# Patient Record
Sex: Female | Born: 1975 | Race: Black or African American | Hispanic: No | Marital: Married | Smoking: Never smoker
Health system: Southern US, Community
[De-identification: ages and names within clinical notes are randomized; demographics above are authoritative.]

## PROBLEM LIST (undated history)

## (undated) DIAGNOSIS — D649 Anemia, unspecified: Secondary | ICD-10-CM

## (undated) DIAGNOSIS — N920 Excessive and frequent menstruation with regular cycle: Secondary | ICD-10-CM

## (undated) DIAGNOSIS — D250 Submucous leiomyoma of uterus: Secondary | ICD-10-CM

## (undated) DIAGNOSIS — N6452 Nipple discharge: Secondary | ICD-10-CM

## (undated) DIAGNOSIS — Z9289 Personal history of other medical treatment: Secondary | ICD-10-CM

## (undated) DIAGNOSIS — G43909 Migraine, unspecified, not intractable, without status migrainosus: Secondary | ICD-10-CM

## (undated) DIAGNOSIS — T8859XA Other complications of anesthesia, initial encounter: Secondary | ICD-10-CM

## (undated) DIAGNOSIS — Z973 Presence of spectacles and contact lenses: Secondary | ICD-10-CM

## (undated) HISTORY — PX: LAPAROSCOPY: SHX197

## (undated) HISTORY — PX: AXILLARY HIDRADENITIS EXCISION: SUR522

## (undated) HISTORY — PX: DILATATION & CURETTAGE/HYSTEROSCOPY WITH MYOSURE: SHX6511

## (undated) HISTORY — PX: HYDRADENITIS EXCISION: SHX5243

---

## 1999-01-11 ENCOUNTER — Emergency Department (HOSPITAL_COMMUNITY): Admission: EM | Admit: 1999-01-11 | Discharge: 1999-01-11 | Payer: Self-pay | Admitting: Emergency Medicine

## 1999-06-21 ENCOUNTER — Other Ambulatory Visit: Admission: RE | Admit: 1999-06-21 | Discharge: 1999-06-21 | Payer: Self-pay | Admitting: Obstetrics & Gynecology

## 2000-03-15 ENCOUNTER — Ambulatory Visit (HOSPITAL_COMMUNITY): Admission: RE | Admit: 2000-03-15 | Discharge: 2000-03-15 | Payer: Self-pay | Admitting: Obstetrics & Gynecology

## 2000-03-15 HISTORY — PX: LAPAROSCOPY: SHX197

## 2002-10-23 ENCOUNTER — Emergency Department (HOSPITAL_COMMUNITY): Admission: EM | Admit: 2002-10-23 | Discharge: 2002-10-23 | Payer: Self-pay | Admitting: Emergency Medicine

## 2002-10-25 ENCOUNTER — Emergency Department (HOSPITAL_COMMUNITY): Admission: EM | Admit: 2002-10-25 | Discharge: 2002-10-25 | Payer: Self-pay | Admitting: Emergency Medicine

## 2003-09-29 ENCOUNTER — Emergency Department (HOSPITAL_COMMUNITY): Admission: EM | Admit: 2003-09-29 | Discharge: 2003-09-29 | Payer: Self-pay | Admitting: Emergency Medicine

## 2004-05-23 ENCOUNTER — Other Ambulatory Visit: Admission: RE | Admit: 2004-05-23 | Discharge: 2004-05-23 | Payer: Self-pay | Admitting: Obstetrics & Gynecology

## 2004-10-17 ENCOUNTER — Encounter: Admission: RE | Admit: 2004-10-17 | Discharge: 2004-10-17 | Payer: Self-pay | Admitting: Obstetrics and Gynecology

## 2004-12-31 ENCOUNTER — Encounter (INDEPENDENT_AMBULATORY_CARE_PROVIDER_SITE_OTHER): Payer: Self-pay | Admitting: Specialist

## 2004-12-31 ENCOUNTER — Inpatient Hospital Stay (HOSPITAL_COMMUNITY): Admission: AD | Admit: 2004-12-31 | Discharge: 2005-01-03 | Payer: Self-pay | Admitting: Obstetrics & Gynecology

## 2009-10-21 ENCOUNTER — Emergency Department (HOSPITAL_COMMUNITY): Admission: EM | Admit: 2009-10-21 | Discharge: 2009-10-21 | Payer: Self-pay | Admitting: Emergency Medicine

## 2010-07-31 LAB — URINE CULTURE: Colony Count: 75000

## 2010-07-31 LAB — POCT URINALYSIS DIP (DEVICE)
Glucose, UA: 250 mg/dL — AB
Specific Gravity, Urine: 1.015 (ref 1.005–1.030)
Urobilinogen, UA: 2 mg/dL — ABNORMAL HIGH (ref 0.0–1.0)

## 2010-07-31 LAB — URINALYSIS, MICROSCOPIC ONLY
Ketones, ur: 15 mg/dL — AB
Specific Gravity, Urine: 1.01 (ref 1.005–1.030)
Urobilinogen, UA: 1 mg/dL (ref 0.0–1.0)

## 2010-07-31 LAB — GLUCOSE, CAPILLARY: Glucose-Capillary: 107 mg/dL — ABNORMAL HIGH (ref 70–99)

## 2010-07-31 LAB — POCT PREGNANCY, URINE: Preg Test, Ur: NEGATIVE

## 2010-09-29 NOTE — Op Note (Signed)
[facility]  Patient:    ICESS, BERTONI                         MRN: [id number] Proc. Date: 03/15/00 Adm. Date:  [id number] Attending:  Lars Pinks                           Operative Report  PREOPERATIVE DIAGNOSES:       Chronic left lower quadrant pain, dyspareunia, dysmenorrhea.  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  PROCEDURE:                    Laparoscopy, destruction of hydatid of Morgagni.  SURGEON:                      Richard D. Arlyce Dice, M.D.  ANESTHESIA:                   General endotracheal.  ESTIMATED BLOOD LOSS:         20 cc.  FINDINGS:                     The right tube and ovary were perfectly normal. The left ovary was perfectly normal.  The left fallopian tube was free and mobile.  There were multiple small hydatid of Morgagni on the distal portion of the left tube and one of these was on a long stalk that appeared torsed. The uterus appeared normal.  There was no evidence of adhesions or endometriosis in the cul-de-sac or along the uterosacrals or anywhere else in the pelvis.  The appendix appeared normal.  INDICATIONS:                  This is a 35 year old female who has had chronic pelvic pain for several years.  The patients pain is only poorly controlled with over-the-counter pain medications and oral contraceptives.  Because of the persistence of the patients pain and poor response to conservative measures, decision was made to do a diagnostic laparoscopy to rule out endometriosis or other pelvic pathology.  DESCRIPTION OF PROCEDURE:     The patient was taken to the operating room, placed in a supine position, and general endotracheal anesthesia was induced. She was then placed in the dorsal lithotomy position and the abdomen, vagina, and perineum were prepped and draped in a sterile fashion.  The Veress  needle was introduced through a small umbilical incision into the peritoneal cavity and a pneumoperitoneum was created.  A 5 mm port was then placed through the umbilicus and the 5 mm scope was then introduced.  The pelvis was viewed with the findings noted above.  A secondary 5 mm port was placed through a suprapubic stab wound and the pelvis was inspected.  The hydatid of Morgagni were then grasped with forceps.  A second port was placed in the right lower quadrant and while the hydatids were placed on stretch, the stalks were cauterized and then cut.  Four hydatids were removed in this way.  The hydatids after they had been severed were then cauterized and left in the peritoneal cavity, although they were no longer attached to the tube and they had been sterilely fulgurated.  The procedure at this point was then terminated.  The gas was allowed to  escape.  The suprapubic and right lower quadrant stab wounds were closed with Steri-Strips.  The umbilical 5 mm port was left unsutured. DD:  03/15/00 TD:  03/15/00 Job: [id number]

## 2010-09-29 NOTE — Discharge Summary (Signed)
Teresa [name], Teresa [name]                  ACCOUNT NO.:  [id number]   MEDICAL RECORD NO.:  [id number]          PATIENT TYPE:  INP   LOCATION:  9146                          FACILITY:  [facility]   PHYSICIAN:  Randye Lobo, M.D.   DATE OF BIRTH:  August 06, 1975   DATE OF ADMISSION:  12/31/2004  DATE OF DISCHARGE:  01/03/2005                                 DISCHARGE SUMMARY   FINAL DIAGNOSES:  1.  Intrauterine pregnancy at term.  2.  Gestational diabetes mellitus.  3.  Suspected macrosomia.   PROCEDURE:  Primary low transverse cesarean section.  Surgeon Dr. Annamaria Helling.   COMPLICATIONS:  None.   This 35 year old G1 P0 was scheduled for a primary low transverse cesarean  section at term for suspected macrosomia based on ultrasound in the office  on August 18.  The patient's antepartum course up to this point was  complicated by gestational diabetes mellitus.  The patient was diet-  controlled.  She had a negative group B strep culture in the office at 35  weeks.   She began contracting on that period of August 20 and presented to maternity  admissions, where her fundus measured 43 cm.  At this time, cervical exam,  the patient was 1 cm dilated, 60% effaced.  Because of this finding and the  patient's previously-scheduled cesarean section, a decision was made to go  ahead and carry out a cesarean section at this time.  The patient was taken  to the operating room on December 31, 2004, by Dr. Annamaria Helling, where a  primary low transverse cesarean section was performed with the delivery of  an 8 pound 13 ounce female infant with Apgars of 8 and 9.  Delivery went  without complications.  The patient's postoperative course was benign  without significant fevers.  She was felt ready for discharge on  postoperative day #3.   She was sent home on a regular diet, told to decrease her activities.  Told  to continue her prenatal vitamins and an iron supplement daily, was given  Percocet one to two every  four hours as needed for pain.  Told she could use  over-the-counter ibuprofen up to 600 mg every six hours as needed for pain.  Told she could take Tums for her calcium supplement, and was to follow up in  the office in four weeks.   LABS ON DISCHARGE:  The patient had a hemoglobin of 10.2, white blood cell  count of 8.8.     Leilani Able, P.A.-C.      Randye Lobo, M.D.  Electronically Signed   MB/MEDQ  D:  02/15/2005  T:  02/15/2005  Job:  [id number]

## 2010-09-29 NOTE — Op Note (Signed)
NAMEKHALAYA, MCGURN                  ACCOUNT NO.:  [id number]   MEDICAL RECORD NO.:  [id number]          PATIENT TYPE:  INP   LOCATION:  9146                          FACILITY:  [facility]   PHYSICIAN:  Gerrit Friends. Aldona Bar, M.D.   DATE OF BIRTH:  February 14, 1976   DATE OF PROCEDURE:  12/31/2004  DATE OF DISCHARGE:                                 OPERATIVE REPORT   PREOPERATIVE DIAGNOSES:  Term pregnancy, gestational diabetes, suspected  macrosomia.   POSTOPERATIVE DIAGNOSES:  Term pregnancy, gestational diabetes, suspected  macrosomia. Plus delivery of 8 pounds 13 ounces female infant, Apgars 08/09.   PROCEDURE:  Primary low transverse cesarean section.   SURGEON:  Gerrit Friends. Aldona Bar, M.D.   ANESTHESIA:  Spinal, Dr. Jean Rosenthal.   HISTORY:  This 35 year old primigravida at term was scheduled for a primary  low transverse cesarean section on January 03, 2005, for suspected macrosomia  based on ultrasound done in the office on August 17. She began contracting  on the afternoon of August 20 and presented to maternity admissions for  evaluation where she measured 43 cm and cervix was 1 cm, 60% effaced with  vertex ballotable. All was consistent with suspected macrosomia and because  of the patient being previously scheduled decision was made to go ahead and  carry out a primary cesarean section at this time.   DESCRIPTION OF PROCEDURE:  The patient was taken to the operating room where  after satisfactory induction of spinal anesthetic by Dr. Jean Rosenthal, she was  prepped and draped having placed in supine position slightly tilted left  with Foley catheter inserted as part of the prep.   After good anesthetic levels were documented a Pfannenstiel incision was  made and with minimal difficulty dissected down sharply to and through the  fascia in low transverse fashion with ease with hemostasis created at each  layer. Subfascial space was created inferiorly and superiorly, muscles  separated in midline.  Peritoneum identified and entered appropriately with  care taken to avoid the bowel superiorly and the bladder inferiorly. At this  time the bladder blade was placed and vesicouterine peritoneum was incised  in a low transverse fashion and pushed off the lower uterine segment with  ease. Sharp incision to uterus with Metzenbaum scissors was carried out in a  low transverse fashion and extended laterally with fingers. Amniotomy was  produced with very lightly stained meconium stained fluid noted. At this  time with minimal difficulty a viable female infant was delivered from  vertex position. Infant cried spontaneously at once and after the cord was  clamped and cut the infant was passed off to the waiting team headed up by  Dr. Ruben Gottron. Infant was subsequently taken to nursery in good condition  and  found to weight 8 pounds 13 ounces and have Apgars of 08/09.   Once placenta was delivered intact. It was sent off to pathology. Uterus was  then exteriorized, manually inspected and noted be free of any remaining  products conception and with good uterine contractility afforded with slowly  given intravenous Pitocin and manual stimulation, uterus  was closed in  single layer of #1 Vicryl running locking fashion. Hemostasis was excellent.  Tubes and ovaries appeared normal. At this time the abdomen was lavaged of  all free blood and clot. The uterus placed in the abdominal cavity and  closure of the abdomen was begun in layers after all counts noted to be  correct and no foreign bodies noted to be remaining in abdominal cavity. The  abdominal peritoneum was closed with 0 Vicryl in a running fashion. The  muscles secured with same. Assured of good subfascial hemostasis, fascia was  reapproximated with 0 Vicryl from angle to midline bilaterally. Subcutaneous  tissues was rendered hemostatic and staples were used to close skin. Sterile  pressure was applied and at this time the patient was  transported to  recovery in satisfactory condition having tolerated well. Estimated blood  loss 500 mL. All counts correct x2. At the conclusion of procedure both  mother and baby were doing well in respective recovery areas.      Gerrit Friends. Aldona Bar, M.D.  Electronically Signed     RMW/MEDQ  D:  12/31/2004  T:  01/01/2005  Job:  [id number]

## 2010-10-25 ENCOUNTER — Other Ambulatory Visit (INDEPENDENT_AMBULATORY_CARE_PROVIDER_SITE_OTHER): Payer: Self-pay | Admitting: Surgery

## 2010-10-29 LAB — WOUND CULTURE

## 2010-12-06 ENCOUNTER — Ambulatory Visit (HOSPITAL_BASED_OUTPATIENT_CLINIC_OR_DEPARTMENT_OTHER)
Admission: RE | Admit: 2010-12-06 | Discharge: 2010-12-06 | Disposition: A | Discharge status: Home / Self Care | Payer: BC Managed Care – PPO | Source: Ambulatory Visit | Attending: Surgery | Admitting: Surgery

## 2010-12-06 ENCOUNTER — Other Ambulatory Visit (INDEPENDENT_AMBULATORY_CARE_PROVIDER_SITE_OTHER): Payer: Self-pay | Admitting: General Surgery

## 2010-12-06 DIAGNOSIS — L732 Hidradenitis suppurativa: Secondary | ICD-10-CM

## 2010-12-06 DIAGNOSIS — Z538 Procedure and treatment not carried out for other reasons: Secondary | ICD-10-CM | POA: Insufficient documentation

## 2010-12-06 DIAGNOSIS — L910 Hypertrophic scar: Secondary | ICD-10-CM

## 2010-12-06 DIAGNOSIS — Z01812 Encounter for preprocedural laboratory examination: Secondary | ICD-10-CM | POA: Insufficient documentation

## 2010-12-06 LAB — POCT HEMOGLOBIN-HEMACUE
Hemoglobin: 7.9 g/dL — ABNORMAL LOW (ref 12.0–15.0)
Hemoglobin: 8.4 g/dL — ABNORMAL LOW (ref 12.0–15.0)

## 2011-02-07 ENCOUNTER — Encounter (HOSPITAL_BASED_OUTPATIENT_CLINIC_OR_DEPARTMENT_OTHER)
Admission: RE | Admit: 2011-02-07 | Discharge: 2011-02-07 | Disposition: A | Discharge status: Home / Self Care | Payer: BC Managed Care – PPO | Source: Ambulatory Visit | Attending: Specialist | Admitting: Specialist

## 2011-02-07 LAB — BASIC METABOLIC PANEL
CO2: 26 mEq/L (ref 19–32)
Chloride: 104 mEq/L (ref 96–112)
Sodium: 138 mEq/L (ref 135–145)

## 2011-02-07 LAB — DIFFERENTIAL
Basophils Absolute: 0 10*3/uL (ref 0.0–0.1)
Eosinophils Relative: 2 % (ref 0–5)
Lymphocytes Relative: 35 % (ref 12–46)
Monocytes Relative: 9 % (ref 3–12)
Neutro Abs: 2.2 10*3/uL (ref 1.7–7.7)

## 2011-02-07 LAB — CBC
HCT: 29.6 % — ABNORMAL LOW (ref 36.0–46.0)
Hemoglobin: 8.7 g/dL — ABNORMAL LOW (ref 12.0–15.0)
RBC: 4.9 MIL/uL (ref 3.87–5.11)

## 2011-02-07 LAB — PREGNANCY, URINE: Preg Test, Ur: NEGATIVE

## 2011-02-12 ENCOUNTER — Ambulatory Visit (HOSPITAL_BASED_OUTPATIENT_CLINIC_OR_DEPARTMENT_OTHER)
Admission: RE | Admit: 2011-02-12 | Discharge: 2011-02-12 | Disposition: A | Discharge status: Home / Self Care | Payer: BC Managed Care – PPO | Source: Ambulatory Visit | Attending: Specialist | Admitting: Specialist

## 2011-02-12 ENCOUNTER — Other Ambulatory Visit: Payer: Self-pay | Admitting: Specialist

## 2011-02-12 DIAGNOSIS — Z01812 Encounter for preprocedural laboratory examination: Secondary | ICD-10-CM | POA: Insufficient documentation

## 2011-02-12 DIAGNOSIS — L732 Hidradenitis suppurativa: Secondary | ICD-10-CM | POA: Insufficient documentation

## 2011-02-12 HISTORY — PX: AXILLARY HIDRADENITIS EXCISION: SUR522

## 2011-02-12 LAB — POCT HEMOGLOBIN-HEMACUE: Hemoglobin: 8.6 g/dL — ABNORMAL LOW (ref 12.0–15.0)

## 2011-03-19 NOTE — Op Note (Signed)
  NAMESHABANA, ARMENTROUT                  ACCOUNT NO.:  [id number]  MEDICAL RECORD NO.:  [id number]  LOCATION:                                 FACILITY:  PHYSICIAN:  Earvin Hansen L. 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  Conservative treatment has been done for intensive times with no avail.  She has had multiple I and D's and abscess formations causing increased pain, drainage, and smell and social adversities.  PROCEDURES PLAN:  Excision of hidradenitis of left axilla, closure of defect with Penni Bombard closure flap advancement.  SURGEON:  Yaakov Guthrie. Shon Hough, MD  ANESTHESIA:  General.  The patient underwent general anesthesia, intubated orally.  Prep was done to the left chest, neck, face, and arm areas with Hibiclens soap and solution, walled off with sterile towels and drapes so as to make a sterile field.  Marcaine had been used to outline the whole flap of the excision of the hidradenitis and all hair-bearing areas.  0.25% Xylocaine with epinephrine 1:400,000 concentration was injected locally for vasoconstriction, a total of 100 mL.  The outline of the marked areas was done with #15 blade and dissection was carried down to underlying superficial fascia for a total excision of the area. Hemostasis was maintained with the Bovie anticoagulation.  Next, the medial and lateral flaps were freed out approximately 6 cm to allow flap advancement and closure using 2-0 Monocryl to the deep tissue and subcutaneously in the fascia, subcutaneous tissue deep, 2-0 Monocryl, subdermal suture of 3-0 Monocryl, and then a running subcuticular stitch of 3-0 Monocryl.  Steri-Strips and sterile dressing were applied to all areas.  She withstood the procedures very well and  was taken to recovery in excellent position.  ESTIMATED BLOOD LOSS:  Less than 75 mL.  COMPLICATIONS:  None.     Yaakov Guthrie. Shon Hough, M.D.     Cathie Hoops  D:  02/12/2011  T:  02/12/2011  Job:  [id number]  Electronically Signed by Louisa Second M.D. on 03/19/2011 07:10:12 PM

## 2011-04-24 ENCOUNTER — Encounter (HOSPITAL_BASED_OUTPATIENT_CLINIC_OR_DEPARTMENT_OTHER): Payer: Self-pay | Admitting: *Deleted

## 2011-04-24 NOTE — Pre-Procedure Instructions (Signed)
To come for CBC, diff, BMET, BP check

## 2011-04-25 ENCOUNTER — Encounter (HOSPITAL_BASED_OUTPATIENT_CLINIC_OR_DEPARTMENT_OTHER)
Admission: RE | Admit: 2011-04-25 | Discharge: 2011-04-25 | Disposition: A | Discharge status: Home / Self Care | Payer: BC Managed Care – PPO | Source: Ambulatory Visit | Attending: Specialist | Admitting: Specialist

## 2011-04-25 LAB — BASIC METABOLIC PANEL
BUN: 8 mg/dL (ref 6–23)
Chloride: 106 mEq/L (ref 96–112)
GFR calc Af Amer: >90 mL/min (ref >90)
Potassium: 4 mEq/L (ref 3.5–5.1)

## 2011-04-25 LAB — DIFFERENTIAL
Basophils Absolute: 0 10*3/uL (ref 0.0–0.1)
Basophils Relative: 1 % (ref 0–1)
Eosinophils Relative: 2 % (ref 0–5)
Lymphocytes Relative: 27 % (ref 12–46)
Monocytes Relative: 9 % (ref 3–12)
Neutro Abs: 2.6 10*3/uL (ref 1.7–7.7)

## 2011-04-25 LAB — CBC
HCT: 26.8 % — ABNORMAL LOW (ref 36.0–46.0)
Hemoglobin: 7.7 g/dL — ABNORMAL LOW (ref 12.0–15.0)
RBC: 4.4 MIL/uL (ref 3.87–5.11)
RDW: 17.6 % — ABNORMAL HIGH (ref 11.5–15.5)
WBC: 4.3 10*3/uL (ref 4.0–10.5)

## 2011-04-27 ENCOUNTER — Other Ambulatory Visit: Payer: Self-pay | Admitting: Specialist

## 2011-04-27 NOTE — H&P (Signed)
Teresa [name] is an 35 y.o. female.   Chief Complaint:  HPI:   Past Medical History  Diagnosis Date  . PONV (postoperative nausea and vomiting)   . Headache     migraines  . Anemia     no current med.  . Right axillary hidradenitis     Past Surgical History  Procedure Date  . Axillary hidradenitis excision 02/12/2011    left  . Cesarean section 12/31/2004  . Laparoscopy 03/15/2000    destruction of hydatid of Morgagni    No family history on file. Social History:  reports that she has never smoked. She has never used smokeless tobacco. She reports that she does not drink alcohol or use illicit drugs.  Allergies: No Known Allergies  No current outpatient prescriptions on file as of 04/27/2011.   No current facility-administered medications on file as of 04/27/2011.    No results found for this or any previous visit (from the past 48 hour(s)). No results found.  ROS  Last menstrual period 04/16/2011. Physical Exam   Assessment/Plan   Undra Trembath L 04/27/2011, 11:20 AM

## 2011-04-30 ENCOUNTER — Ambulatory Visit (HOSPITAL_BASED_OUTPATIENT_CLINIC_OR_DEPARTMENT_OTHER)
Admission: RE | Admit: 2011-04-30 | Discharge: 2011-04-30 | Disposition: A | Discharge status: Home / Self Care | Payer: BC Managed Care – PPO | Source: Ambulatory Visit | Attending: Specialist | Admitting: Specialist

## 2011-04-30 ENCOUNTER — Encounter (HOSPITAL_BASED_OUTPATIENT_CLINIC_OR_DEPARTMENT_OTHER): Payer: Self-pay | Admitting: Anesthesiology

## 2011-04-30 ENCOUNTER — Encounter (HOSPITAL_BASED_OUTPATIENT_CLINIC_OR_DEPARTMENT_OTHER): Payer: Self-pay | Admitting: *Deleted

## 2011-04-30 ENCOUNTER — Encounter (HOSPITAL_BASED_OUTPATIENT_CLINIC_OR_DEPARTMENT_OTHER)
Admission: RE | Disposition: A | Discharge status: Home / Self Care | Payer: Self-pay | Source: Ambulatory Visit | Attending: Specialist

## 2011-04-30 DIAGNOSIS — Z538 Procedure and treatment not carried out for other reasons: Secondary | ICD-10-CM | POA: Insufficient documentation

## 2011-04-30 DIAGNOSIS — L732 Hidradenitis suppurativa: Secondary | ICD-10-CM | POA: Insufficient documentation

## 2011-04-30 DIAGNOSIS — Z01812 Encounter for preprocedural laboratory examination: Secondary | ICD-10-CM | POA: Insufficient documentation

## 2011-04-30 SURGERY — EXCISION, HIDRADENITIS, INGUINAL REGION
Anesthesia: General | Site: Axilla | Laterality: Right

## 2011-04-30 MED ORDER — CEFAZOLIN SODIUM 1-5 GM-% IV SOLN: 1.0000 g | INTRAVENOUS | Status: DC

## 2011-04-30 MED ORDER — LACTATED RINGERS IV SOLN
INTRAVENOUS | Status: DC
  Administered 2011-04-30: 10:00:00 via INTRAVENOUS

## 2011-04-30 MED ORDER — SCOPOLAMINE 1 MG/3DAYS TD PT72
1.0000 | MEDICATED_PATCH | Freq: Once | TRANSDERMAL | Status: DC
  Administered 2011-04-30: 1.5 mg via TRANSDERMAL

## 2011-04-30 SURGICAL SUPPLY — 50 items
BAG DECANTER FOR FLEXI CONT (MISCELLANEOUS) IMPLANT
BLADE HEX COATED 2.75 (ELECTRODE) IMPLANT
BLADE KNIFE PERSONA 10 (BLADE) IMPLANT
BLADE KNIFE PERSONA 15 (BLADE) IMPLANT
BRIEF STRETCH FOR OB PAD LRG (UNDERPADS AND DIAPERS) IMPLANT
CANISTER SUCTION 1200CC (MISCELLANEOUS) IMPLANT
CLOTH BEACON ORANGE TIMEOUT ST (SAFETY) IMPLANT
COVER MAYO STAND STRL (DRAPES) IMPLANT
DECANTER SPIKE VIAL GLASS SM (MISCELLANEOUS) IMPLANT
DRESSING TELFA 8X3 (GAUZE/BANDAGES/DRESSINGS) IMPLANT
DRSG PAD ABDOMINAL 8X10 ST (GAUZE/BANDAGES/DRESSINGS) IMPLANT
ELECT REM PT RETURN 9FT ADLT (ELECTROSURGICAL)
ELECTRODE REM PT RTRN 9FT ADLT (ELECTROSURGICAL) IMPLANT
FILTER 7/8 IN (FILTER) IMPLANT
GAUZE XEROFORM 5X9 LF (GAUZE/BANDAGES/DRESSINGS) IMPLANT
GLOVE ECLIPSE 7.0 STRL STRAW (GLOVE) IMPLANT
GOWN PREVENTION PLUS XLARGE (GOWN DISPOSABLE) IMPLANT
GOWN PREVENTION PLUS XXLARGE (GOWN DISPOSABLE) IMPLANT
IV NS 500ML (IV SOLUTION)
IV NS 500ML BAXH (IV SOLUTION) IMPLANT
NDL SAFETY ECLIPSE 18X1.5 (NEEDLE) IMPLANT
NEEDLE FILTER BLUNT 18X 1/2SAF (NEEDLE)
NEEDLE FILTER BLUNT 18X1 1/2 (NEEDLE) IMPLANT
NEEDLE HYPO 18GX1.5 SHARP (NEEDLE)
NEEDLE HYPO 25X1 1.5 SAFETY (NEEDLE) IMPLANT
NEEDLE SPNL 18GX3.5 QUINCKE PK (NEEDLE) IMPLANT
NS IRRIG 1000ML POUR BTL (IV SOLUTION) IMPLANT
PACK BASIN DAY SURGERY FS (CUSTOM PROCEDURE TRAY) IMPLANT
PACK LITHOTOMY IV (CUSTOM PROCEDURE TRAY) IMPLANT
PEN SKIN MARKING BROAD TIP (MISCELLANEOUS) IMPLANT
PENCIL BUTTON HOLSTER BLD 10FT (ELECTRODE) IMPLANT
SHEET MEDIUM DRAPE 40X70 STRL (DRAPES) IMPLANT
SPONGE GAUZE 4X4 12PLY (GAUZE/BANDAGES/DRESSINGS) IMPLANT
SPONGE LAP 18X18 X RAY DECT (DISPOSABLE) IMPLANT
SUT ETHILON 3 0 FSL (SUTURE) IMPLANT
SUT MNCRL AB 3-0 PS2 18 (SUTURE) IMPLANT
SUT MON AB 2-0 CT1 36 (SUTURE) IMPLANT
SUT PROLENE 4 0 P 3 18 (SUTURE) IMPLANT
SUT VIC AB 2-0 CT1 27 (SUTURE)
SUT VIC AB 2-0 CT1 TAPERPNT 27 (SUTURE) IMPLANT
SUT VIC AB 3-0 FS2 27 (SUTURE) IMPLANT
SYR 20CC LL (SYRINGE) IMPLANT
SYR 50ML LL SCALE MARK (SYRINGE) IMPLANT
TOWEL OR 17X24 6PK STRL BLUE (TOWEL DISPOSABLE) IMPLANT
TRAY DSU PREP LF (CUSTOM PROCEDURE TRAY) IMPLANT
TUBE CONNECTING 20X1/4 (TUBING) IMPLANT
UNDERPAD 30X30 INCONTINENT (UNDERPADS AND DIAPERS) IMPLANT
VAC PENCILS W/TUBING CLEAR (MISCELLANEOUS) IMPLANT
WATER STERILE IRR 1000ML POUR (IV SOLUTION) IMPLANT
YANKAUER SUCT BULB TIP NO VENT (SUCTIONS) IMPLANT

## 2011-04-30 NOTE — Anesthesia Preprocedure Evaluation (Deleted)
Anesthesia Evaluation  Patient identified by MRN, date of birth, ID band Patient awake    Reviewed: Allergy & Precautions, H&P , NPO status , Patient's Chart, lab work & pertinent test results  History of Anesthesia Complications (+) PONV  Airway Mallampati: I TM Distance: >3 FB Neck ROM: Full    Dental No notable dental hx. (+) Teeth Intact and Dental Advisory Given   Pulmonary neg pulmonary ROS,  clear to auscultation  Pulmonary exam normal       Cardiovascular neg cardio ROS Regular Normal    Neuro/Psych    GI/Hepatic negative GI ROS, Neg liver ROS,   Endo/Other  Negative Endocrine ROS  Renal/GU negative Renal ROS     Musculoskeletal negative musculoskeletal ROS (+)   Abdominal   Peds  Hematology Anemic, recheck Hb today   Anesthesia Other Findings   Reproductive/Obstetrics LMP within the month, no chance pregnant                          Anesthesia Physical Anesthesia Plan  ASA: I  Anesthesia Plan: General   Post-op Pain Management:    Induction: Intravenous  Airway Management Planned: Oral ETT  Additional Equipment:   Intra-op Plan:   Post-operative Plan:   Informed Consent: I have reviewed the patients History and Physical, chart, labs and discussed the procedure including the risks, benefits and alternatives for the proposed anesthesia with the patient or authorized representative who has indicated his/her understanding and acceptance.   Dental advisory given  Plan Discussed with: CRNA and Surgeon  Anesthesia Plan Comments: (Dr. Shon Hough cancels patient for today.  She remains anemic, Hb 7.9.  Will reschedule when Hb improved.  Sandford Craze, MD)        Anesthesia Quick Evaluation

## 2011-05-04 ENCOUNTER — Other Ambulatory Visit: Payer: Self-pay | Admitting: Specialist

## 2011-05-09 ENCOUNTER — Encounter (HOSPITAL_BASED_OUTPATIENT_CLINIC_OR_DEPARTMENT_OTHER): Payer: Self-pay | Admitting: *Deleted

## 2011-05-09 NOTE — Pre-Procedure Instructions (Signed)
To come for CBC, diff, BMET and BP check 

## 2011-05-10 ENCOUNTER — Encounter (HOSPITAL_BASED_OUTPATIENT_CLINIC_OR_DEPARTMENT_OTHER): Payer: BC Managed Care – PPO | Attending: Specialist

## 2011-05-10 ENCOUNTER — Emergency Department (INDEPENDENT_AMBULATORY_CARE_PROVIDER_SITE_OTHER)
Admission: EM | Admit: 2011-05-10 | Discharge: 2011-05-10 | Disposition: A | Discharge status: Home / Self Care | Payer: BC Managed Care – PPO | Source: Home / Self Care | Attending: Family Medicine | Admitting: Family Medicine

## 2011-05-10 ENCOUNTER — Encounter (HOSPITAL_COMMUNITY): Payer: Self-pay | Admitting: *Deleted

## 2011-05-10 DIAGNOSIS — R6889 Other general symptoms and signs: Secondary | ICD-10-CM

## 2011-05-10 LAB — CBC
HCT: 30.1 % — ABNORMAL LOW (ref 36.0–46.0)
Platelets: 158 10*3/uL (ref 150–400)
RDW: 17.9 % — ABNORMAL HIGH (ref 11.5–15.5)
WBC: 5.8 10*3/uL (ref 4.0–10.5)

## 2011-05-10 LAB — BASIC METABOLIC PANEL
Chloride: 98 mEq/L (ref 96–112)
Creatinine, Ser: 0.75 mg/dL (ref 0.50–1.10)
GFR calc Af Amer: >90 mL/min (ref >90)

## 2011-05-10 LAB — DIFFERENTIAL
Basophils Absolute: 0 10*3/uL (ref 0.0–0.1)
Lymphocytes Relative: 5 % — ABNORMAL LOW (ref 12–46)
Monocytes Absolute: 0.7 10*3/uL (ref 0.1–1.0)
Neutro Abs: 4.7 10*3/uL (ref 1.7–7.7)

## 2011-05-10 MED ORDER — HYDROCOD POLST-CHLORPHEN POLST 10-8 MG/5ML PO LQCR: 5.0000 mL | Freq: Two times a day (BID) | ORAL | Status: DC

## 2011-05-10 NOTE — Progress Notes (Signed)
bp check 129 /88  Pat visit .  Pt states having chills body aches and fever.  Pt going to urgent care when leaving pat visit.  Instructed pt to call dr Yehuda Savannah on Sunday with update of her condition and to inform doctor at urgent care she is having surg on 31.   cindi Gay Moncivais  rn

## 2011-05-10 NOTE — ED Notes (Signed)
pT  REPORTS  SYMPTOMS  OF  COUGH /  CONGESTION    BODY  ACHES  WHICH  STARTED  YEST     PT  IS  AWAKE  AS  WELL AS  ALERT AND  ORIENTED    CAP  REFILL  IS  BRISK  PT IS  SPEAKING IN  COMPLETE   SENTANCES

## 2011-05-10 NOTE — ED Provider Notes (Signed)
History     CSN: [id number]  Arrival date & time 05/10/11  1308   First MD Initiated Contact with Patient 05/10/11 1346      Chief Complaint  Patient presents with  . Cough    (Consider location/radiation/quality/duration/timing/severity/associated sxs/prior treatment) Patient is a 35 y.o. female presenting with cough. The history is provided by the patient and the spouse.  Cough This is a new problem. The current episode started 2 days ago. The problem has not changed since onset.The cough is non-productive. The maximum temperature recorded prior to her arrival was 100 to 100.9 F. Associated symptoms include chills, headaches, rhinorrhea, sore throat and myalgias. She has tried decongestants for the symptoms. The treatment provided no relief. She is not a smoker.    Past Medical History  Diagnosis Date  . PONV (postoperative nausea and vomiting)   . Headache     migraines  . Anemia     no current med.  . Right axillary hidradenitis     Past Surgical History  Procedure Date  . Axillary hidradenitis excision 02/12/2011    left  . Cesarean section 12/31/2004  . Laparoscopy 03/15/2000    destruction of hydatid of Morgagni    History reviewed. No pertinent family history.  History  Substance Use Topics  . Smoking status: Never Smoker   . Smokeless tobacco: Never Used  . Alcohol Use: No    OB History    Grav Para Term Preterm Abortions TAB SAB Ect Mult Living                  Review of Systems  Constitutional: Positive for chills.  HENT: Positive for sore throat and rhinorrhea.   Respiratory: Positive for cough.   Gastrointestinal: Negative.   Musculoskeletal: Positive for myalgias.  Neurological: Positive for headaches.    Allergies  Review of patient's allergies indicates no known allergies.  Home Medications   Current Outpatient Rx  Name Route Sig Dispense Refill  . HYDROCOD POLST-CHLORPHEN POLST 10-8 MG/5ML PO LQCR Oral Take 5 mLs by mouth every 12  (twelve) hours. For cough 115 mL 0    BP 133/83  Pulse 112  Temp(Src) 99.5 F (37.5 C) (Oral)  Resp 18  SpO2 100%  LMP 04/16/2011  Physical Exam  Nursing note and vitals reviewed. Constitutional: She appears well-developed and well-nourished.  HENT:  Head: Normocephalic.  Right Ear: External ear normal.  Left Ear: External ear normal.  Mouth/Throat: Oropharynx is clear and moist.  Eyes: Pupils are equal, round, and reactive to light.  Neck: Normal range of motion. Neck supple.  Cardiovascular: Normal rate, regular rhythm, normal heart sounds and intact distal pulses.   Pulmonary/Chest: Effort normal and breath sounds normal.  Abdominal: Soft. Bowel sounds are normal.  Lymphadenopathy:    She has no cervical adenopathy.  Skin: Skin is warm and dry.    ED Course  Procedures (including critical care time)  Labs Reviewed - No data to display No results found.   1. Influenza-like illness       MDM          Barkley Bruns, MD 05/10/11 [phone]

## 2011-05-11 NOTE — Progress Notes (Signed)
Spoke with dr Shon Hough. Hem reported . Dr Shon Hough ok with lab,  Informed pt not feeling well fever chills body aches .Marland KitchenHe will call pt today and check on her. Will call back if any change concerning surg.

## 2011-05-14 ENCOUNTER — Ambulatory Visit (HOSPITAL_BASED_OUTPATIENT_CLINIC_OR_DEPARTMENT_OTHER)
Admission: RE | Admit: 2011-05-14 | Discharge: 2011-05-14 | Disposition: A | Discharge status: Home / Self Care | Payer: BC Managed Care – PPO | Source: Ambulatory Visit | Attending: Specialist | Admitting: Specialist

## 2011-05-14 ENCOUNTER — Encounter (HOSPITAL_BASED_OUTPATIENT_CLINIC_OR_DEPARTMENT_OTHER): Payer: Self-pay | Admitting: Anesthesiology

## 2011-05-14 ENCOUNTER — Encounter (HOSPITAL_BASED_OUTPATIENT_CLINIC_OR_DEPARTMENT_OTHER)
Admission: RE | Disposition: A | Discharge status: Home / Self Care | Payer: Self-pay | Source: Ambulatory Visit | Attending: Specialist

## 2011-05-14 ENCOUNTER — Encounter (HOSPITAL_BASED_OUTPATIENT_CLINIC_OR_DEPARTMENT_OTHER): Payer: Self-pay | Admitting: *Deleted

## 2011-05-14 DIAGNOSIS — Z5309 Procedure and treatment not carried out because of other contraindication: Secondary | ICD-10-CM | POA: Insufficient documentation

## 2011-05-14 DIAGNOSIS — L732 Hidradenitis suppurativa: Secondary | ICD-10-CM | POA: Insufficient documentation

## 2011-05-14 DIAGNOSIS — D649 Anemia, unspecified: Secondary | ICD-10-CM | POA: Insufficient documentation

## 2011-05-14 DIAGNOSIS — Z01812 Encounter for preprocedural laboratory examination: Secondary | ICD-10-CM | POA: Insufficient documentation

## 2011-05-14 LAB — POCT HEMOGLOBIN-HEMACUE: Hemoglobin: 7.8 g/dL — ABNORMAL LOW (ref 12.0–15.0)

## 2011-05-14 SURGERY — EXCISION, HIDRADENITIS, INGUINAL REGION
Anesthesia: General | Site: Axilla | Laterality: Right

## 2011-05-14 SURGICAL SUPPLY — 50 items
BAG DECANTER FOR FLEXI CONT (MISCELLANEOUS) IMPLANT
BLADE HEX COATED 2.75 (ELECTRODE) IMPLANT
BLADE KNIFE PERSONA 10 (BLADE) IMPLANT
BLADE KNIFE PERSONA 15 (BLADE) IMPLANT
BRIEF STRETCH FOR OB PAD LRG (UNDERPADS AND DIAPERS) IMPLANT
CANISTER SUCTION 1200CC (MISCELLANEOUS) IMPLANT
CLOTH BEACON ORANGE TIMEOUT ST (SAFETY) IMPLANT
COVER MAYO STAND STRL (DRAPES) IMPLANT
DECANTER SPIKE VIAL GLASS SM (MISCELLANEOUS) IMPLANT
DRESSING TELFA 8X3 (GAUZE/BANDAGES/DRESSINGS) IMPLANT
DRSG PAD ABDOMINAL 8X10 ST (GAUZE/BANDAGES/DRESSINGS) IMPLANT
ELECT REM PT RETURN 9FT ADLT (ELECTROSURGICAL)
ELECTRODE REM PT RTRN 9FT ADLT (ELECTROSURGICAL) IMPLANT
FILTER 7/8 IN (FILTER) IMPLANT
GAUZE XEROFORM 5X9 LF (GAUZE/BANDAGES/DRESSINGS) IMPLANT
GLOVE ECLIPSE 7.0 STRL STRAW (GLOVE) IMPLANT
GOWN PREVENTION PLUS XLARGE (GOWN DISPOSABLE) IMPLANT
GOWN PREVENTION PLUS XXLARGE (GOWN DISPOSABLE) IMPLANT
IV NS 500ML (IV SOLUTION)
IV NS 500ML BAXH (IV SOLUTION) IMPLANT
NDL SAFETY ECLIPSE 18X1.5 (NEEDLE) IMPLANT
NEEDLE FILTER BLUNT 18X 1/2SAF (NEEDLE)
NEEDLE FILTER BLUNT 18X1 1/2 (NEEDLE) IMPLANT
NEEDLE HYPO 18GX1.5 SHARP (NEEDLE)
NEEDLE HYPO 25X1 1.5 SAFETY (NEEDLE) IMPLANT
NEEDLE SPNL 18GX3.5 QUINCKE PK (NEEDLE) IMPLANT
NS IRRIG 1000ML POUR BTL (IV SOLUTION) IMPLANT
PACK BASIN DAY SURGERY FS (CUSTOM PROCEDURE TRAY) IMPLANT
PACK LITHOTOMY IV (CUSTOM PROCEDURE TRAY) IMPLANT
PEN SKIN MARKING BROAD TIP (MISCELLANEOUS) IMPLANT
PENCIL BUTTON HOLSTER BLD 10FT (ELECTRODE) IMPLANT
SHEET MEDIUM DRAPE 40X70 STRL (DRAPES) IMPLANT
SPONGE GAUZE 4X4 12PLY (GAUZE/BANDAGES/DRESSINGS) IMPLANT
SPONGE LAP 18X18 X RAY DECT (DISPOSABLE) IMPLANT
SUT ETHILON 3 0 FSL (SUTURE) IMPLANT
SUT MNCRL AB 3-0 PS2 18 (SUTURE) IMPLANT
SUT MON AB 2-0 CT1 36 (SUTURE) IMPLANT
SUT PROLENE 4 0 P 3 18 (SUTURE) IMPLANT
SUT VIC AB 2-0 CT1 27 (SUTURE)
SUT VIC AB 2-0 CT1 TAPERPNT 27 (SUTURE) IMPLANT
SUT VIC AB 3-0 FS2 27 (SUTURE) IMPLANT
SYR 20CC LL (SYRINGE) IMPLANT
SYR 50ML LL SCALE MARK (SYRINGE) IMPLANT
TOWEL OR 17X24 6PK STRL BLUE (TOWEL DISPOSABLE) IMPLANT
TRAY DSU PREP LF (CUSTOM PROCEDURE TRAY) IMPLANT
TUBE CONNECTING 20X1/4 (TUBING) IMPLANT
UNDERPAD 30X30 INCONTINENT (UNDERPADS AND DIAPERS) IMPLANT
VAC PENCILS W/TUBING CLEAR (MISCELLANEOUS) IMPLANT
WATER STERILE IRR 1000ML POUR (IV SOLUTION) IMPLANT
YANKAUER SUCT BULB TIP NO VENT (SUCTIONS) IMPLANT

## 2011-05-14 NOTE — Progress Notes (Signed)
Hemoglobin 7.8 this am, reported to Dr Shon Hough, case cancelled.  Teaching done to patient about need for iron supplement, and iron rich foods.

## 2011-05-16 ENCOUNTER — Encounter (HOSPITAL_BASED_OUTPATIENT_CLINIC_OR_DEPARTMENT_OTHER): Payer: Self-pay | Admitting: Specialist

## 2011-06-29 ENCOUNTER — Other Ambulatory Visit: Payer: Self-pay | Admitting: Obstetrics & Gynecology

## 2011-07-05 ENCOUNTER — Ambulatory Visit: Payer: BC Managed Care – PPO | Admitting: Internal Medicine

## 2011-07-05 DIAGNOSIS — Z0289 Encounter for other administrative examinations: Secondary | ICD-10-CM

## 2011-11-19 ENCOUNTER — Telehealth: Payer: Self-pay | Admitting: *Deleted

## 2011-11-19 NOTE — Telephone Encounter (Signed)
gave patient appointment to come in and see md on 11-21-2011 starting at 10:00am patient confirmed over the phone the new date and time

## 2011-11-20 ENCOUNTER — Telehealth: Payer: Self-pay | Admitting: Oncology

## 2011-11-20 NOTE — Telephone Encounter (Signed)
Referred by Dr. Cain Saupe Dx- HGB 7.7

## 2011-11-21 ENCOUNTER — Ambulatory Visit (HOSPITAL_BASED_OUTPATIENT_CLINIC_OR_DEPARTMENT_OTHER): Payer: BC Managed Care – PPO

## 2011-11-21 ENCOUNTER — Telehealth: Payer: Self-pay | Admitting: Oncology

## 2011-11-21 ENCOUNTER — Other Ambulatory Visit (HOSPITAL_BASED_OUTPATIENT_CLINIC_OR_DEPARTMENT_OTHER): Payer: BC Managed Care – PPO | Admitting: Lab

## 2011-11-21 ENCOUNTER — Ambulatory Visit (HOSPITAL_BASED_OUTPATIENT_CLINIC_OR_DEPARTMENT_OTHER): Payer: BC Managed Care – PPO | Admitting: Oncology

## 2011-11-21 ENCOUNTER — Encounter: Payer: Self-pay | Admitting: Oncology

## 2011-11-21 VITALS — BP 112/78 | HR 90 | Temp 98.2°F | Ht 65.5 in | Wt 160.5 lb

## 2011-11-21 DIAGNOSIS — N924 Excessive bleeding in the premenopausal period: Secondary | ICD-10-CM

## 2011-11-21 DIAGNOSIS — D649 Anemia, unspecified: Secondary | ICD-10-CM

## 2011-11-21 DIAGNOSIS — D500 Iron deficiency anemia secondary to blood loss (chronic): Secondary | ICD-10-CM

## 2011-11-21 LAB — CBC & DIFF AND RETIC
BASO%: 0.3 % (ref 0.0–2.0)
EOS%: 0.5 % (ref 0.0–7.0)
LYMPH%: 18.4 % (ref 14.0–49.7)
MCH: 17.3 pg — ABNORMAL LOW (ref 25.1–34.0)
MCHC: 29.5 g/dL — ABNORMAL LOW (ref 31.5–36.0)
MONO#: 0.5 10*3/uL (ref 0.1–0.9)
Platelets: 217 10*3/uL (ref 145–400)
RBC: 4.67 10*6/uL (ref 3.70–5.45)
Retic %: 1.22 % (ref 0.70–2.10)
WBC: 6.4 10*3/uL (ref 3.9–10.3)
lymph#: 1.2 10*3/uL (ref 0.9–3.3)

## 2011-11-21 LAB — COMPREHENSIVE METABOLIC PANEL
ALT: <8 U/L (ref 0–35)
AST: 14 U/L (ref 0–37)
Albumin: 4.2 g/dL (ref 3.5–5.2)
Calcium: 9.4 mg/dL (ref 8.4–10.5)
Chloride: 104 mEq/L (ref 96–112)
Potassium: 3.9 mEq/L (ref 3.5–5.3)
Sodium: 139 mEq/L (ref 135–145)
Total Protein: 7.1 g/dL (ref 6.0–8.3)

## 2011-11-21 LAB — MORPHOLOGY: PLT EST: ADEQUATE

## 2011-11-21 LAB — LACTATE DEHYDROGENASE: LDH: 155 U/L (ref 94–250)

## 2011-11-21 MED ORDER — SODIUM CHLORIDE 0.9 % IV SOLN
Freq: Once | INTRAVENOUS | Status: AC
  Administered 2011-11-21: 13:00:00 via INTRAVENOUS

## 2011-11-21 MED ORDER — SODIUM CHLORIDE 0.9 % IV SOLN
1020.0000 mg | Freq: Once | INTRAVENOUS | Status: AC
  Administered 2011-11-21: 1020 mg via INTRAVENOUS
  Filled 2011-11-21: qty 34

## 2011-11-21 NOTE — Telephone Encounter (Signed)
gve the pt her oct 2013 appt calendar °

## 2011-11-21 NOTE — Progress Notes (Signed)
Pt tolerated treatment well. Discharged to home.

## 2011-11-21 NOTE — Patient Instructions (Addendum)

## 2011-11-21 NOTE — Progress Notes (Signed)
Patient came in today as a new patient and she has one Editor, commissioning.I did explain to her our financial assistance and co-pay assistance program we offer here at the cancer center,she said that she would not qualify for our financial assistance program because between her income and her husband combine income they make 50,000 a year

## 2011-11-25 NOTE — Progress Notes (Signed)
Referral MD  Dr Hewitt Shorts fulp    Reason for Referral: Iron deficiency anemia , secondary to menorrhagia    HPI: This is a Teresa Teresa [name] 36 year old Teresa [name] who presents with severe iron deficiency anemia. She relates having had a some form of anemia dating back to routine years. Over the past number of years she has had severe menorrhagia with periods lasting 9-14 days and coming every 2 weeks. During the heavy days she has known to be using the tampons and pads  had been switching every 30 minutes. She did see her gynecologist and underwent what sounds like a hysteroscopy in May. This has improved things but she continues to have a moderately heavy periods again with a frequency of every 2 weeks. She has been on oral iron. She did not tolerate this especially well as she does get constipated. She has been increasingly fatigued. She is on some occasions been unable to walk from the car to her place of work. She was due to undergo surgery of on her right axilla this was canceled because of her anemia and she has since been referred to [place] for consideration of IV arm replacement.  Past Medical History  Diagnosis Date  . PONV (postoperative nausea and vomiting)   . Headache     migraines  . Anemia     no current med.  . Right axillary hidradenitis   :  Past Surgical History  Procedure Date  . Axillary hidradenitis excision 02/12/2011    left  . Cesarean section 12/31/2004  . Laparoscopy 03/15/2000    destruction of hydatid of Morgagni  . Hydradenitis excision 05/14/2011    Procedure: EXCISION HYDRADENITIS GROIN;  Surgeon: Teresa Teresa [name] Teresa [name];  Location: [facility];  Service: Plastics;  Laterality: Right;  :  Current outpatient prescriptions:amoxicillin-clavulanate (AUGMENTIN) 500-125 MG per tablet, Take 1 tablet by mouth 3 (three) times daily., Disp: , Rfl: ;  Fe Fum-FePoly-FA-Vit C-Vit B3 (INTEGRA F PO), Take 2 tablets by mouth daily., Disp: , Rfl: ;  HYDROcodone-acetaminophen  (LORTAB) 7.5-500 MG per tablet, Take 1 tablet by mouth every 6 (six) hours as needed., Disp: , Rfl:  promethazine (PHENERGAN) 25 MG tablet, Take 25 mg by mouth every 6 (six) hours as needed., Disp: , Rfl: ;  SUMAtriptan (IMITREX) 25 MG tablet, Take 25 mg by mouth every 2 (two) hours as needed., Disp: , Rfl: ;  chlorpheniramine-HYDROcodone (TUSSIONEX PENNKINETIC ER) 10-8 MG/5ML LQCR, Take 5 mLs by mouth every 12 (twelve) hours. For cough, Disp: 115 mL, Rfl: 0:    :  No Known Allergies:  No family history on file.: Both parents living Teresa Teresa [name] Teresa [name] maternal grandfather died of stomach cancer one brother alive and well. 2 cousins a maternal side with sickle cell trait.   History   Social History  . Marital Status: Married x10    Spouse Name: N/A    Number of Children: 1 child  . Years of Education: N/A   Occupational History  .  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   Social History Main Topics  . Smoking status: Never Smoker   . Smokeless tobacco: Never Used  . Alcohol Use: No  . Drug Use: No  . Sexually Active:    Other Topics Concern  . Not on file   Social History Narrative    she is from [place]   :  ROS Weakness fatigue occasional headaches she has severe PICA  symptoms are about a  pound every 2 days of crushed ice  Exam:  HEENT:  Sclerae anicteric, conjunctivae pink.  Oropharynx clear.  No mucositis or candidiasis.  Nodes:  No cervical, supraclavicular, or axillary lymphadenopathy palpated.  Breast Exam:  Right breast is benign.  No masses, discharge, skin change, or nipple inversion.  Left breast is benign.  No masses, discharge, skin change, or nipple inversion..  Lungs:  Clear to auscultation bilaterally.  No crackles, rhonchi, or wheezes.  Heart:  Regular rate and rhythm.  Abdomen:  Soft, nontender.  Positive bowel sounds.  No organomegaly or masses palpated.  Musculoskeletal:  No focal spinal tenderness to palpation.   Extremities:  Benign.  No peripheral edema or cyanosis.  Skin:  Benign.  Neuro:  Nonfocal.    Blood smear review: Findings compatible with iron deficiency anemia, severe hypochromia occasional fragments seen  Pathology No results found.  Results for Teresa Teresa [name], Teresa [name] (MRN [id number]) as of 11/26/2011 13:02  Ref. Range 11/21/2011 11:24  Sodium Latest Range: 135-145 mEq/L 139  Potassium Latest Range: 3.5-5.3 mEq/L 3.9  Chloride Latest Range: 96-112 mEq/L 104  CO2 Latest Range: 19-32 mEq/L 26  BUN Latest Range: 6-23 mg/dL 6  Creat Latest Range: 0.50-1.10 mg/dL 4.09  Calcium Latest Range: 8.4-10.5 mg/dL 9.4  Glucose Latest Range: 70-99 mg/dL 92  Alkaline Phosphatase Latest Range: 39-117 U/L 70  Albumin Latest Range: 3.5-5.2 g/dL 4.2  AST Latest Range: 0-37 U/L 14  ALT Latest Range: 0-35 U/L <8  Total Protein Latest Range: 6.0-8.3 g/dL 7.1  Total Bilirubin Latest Range: 0.3-1.2 mg/dL 0.4  WBC Latest Range: 4.0-10.5 K/uL 6.4  RBC Latest Range: 3.87-5.11 MIL/uL 4.67  Hemoglobin Latest Range: 12.0-15.0 g/dL 8.1 (L)  HCT Latest Range: 36.0-46.0 % 27.5 (L)  MCV Latest Range: 78.0-100.0 fL 58.9 (L)  MCH Latest Range: 26.0-34.0 pg 17.3 (L)  MCHC Latest Range: 30.0-36.0 g/dL 81.1 (L)  RDW Latest Range: 11.5-15.5 % 17.7 (H)  Platelets Latest Range: 150-400 K/uL 217  NEUT% Latest Range: 38.4-76.8 % 72.7  LYMPH% Latest Range: 14.0-49.7 % 18.4  MONO% Latest Range: 0.0-14.0 % 8.1  EOS% Latest Range: 0.0-7.0 % 0.5  BASO% Latest Range: 0.0-2.0 % 0.3  NEUT# Latest Range: 1.7-7.7 K/uL 4.7  MONO# Latest Range: 0.1-0.9 10e3/uL 0.5  Eosinophils Absolute Latest Range: 0.0-0.5 10e3/uL 0.0  Basophils Absolute Latest Range: 0.0-0.1 K/uL 0.0  lymph# Latest Range: 0.9-3.3 10e3/uL 1.2  Spherocytes Latest Range: Negative  Few  Target Cells Latest Range: Negative  Few  White Cell Comments No range found C/W auto diff  Platelet Morphology Latest Range: Within Normal Limits  Large and giant platelets  Smear  Result No range found Smear Available  Retic % Latest Range: 0.70-2.10 % 1.22  Retic Ct Abs Latest Range: 33.70-90.70 10e3/uL 56.97  Polychromasia Latest Range: Slight  Slight  Tear Drop Cells Latest Range: Negative  Few  Ovalocytes Latest Range: Negative  Few  PLT EST Latest Range: Adequate  Adequate  Immature Retic Fract Latest Range: 1.60-10.00 % 16.60 (H)    Assessment and Plan: Pleasant premenopausal Teresa [name] presents with severe iron deficiency anemia secondary to menorrhagia. Her hemoglobin today is 8.1. She has findings consistent with iron deficiency and the previous labs are consistent with that. I discussed this with her. We will take the opportunity to give her IV iron today. I will followup with her in 3 months time. She knows to call should she have any other concerns. I also recommend that she followup with her gynecologist so that he can  recommend some form of hormonal control so that her periods can be modulated.   45 minutes spent with this patient half the time and patient-related counseling Pierce Crane M.D. FRCP C.

## 2011-12-05 ENCOUNTER — Encounter (HOSPITAL_BASED_OUTPATIENT_CLINIC_OR_DEPARTMENT_OTHER): Payer: Self-pay | Admitting: *Deleted

## 2011-12-05 NOTE — Pre-Procedure Instructions (Signed)
Pt was cancelled 04-2011 due to anemia. Has been seen and treated by Dr Donnie Coffin since. Pt received Feraheme infusion at [facility] 11-21-2011 for H/H 8.1/27.5. She has not been rechecked. Pt is out of town until surgery day, we will check her Hgb on arrival DOS.

## 2011-12-10 ENCOUNTER — Ambulatory Visit (HOSPITAL_BASED_OUTPATIENT_CLINIC_OR_DEPARTMENT_OTHER): Admission: RE | Admit: 2011-12-10 | Payer: BC Managed Care – PPO | Source: Ambulatory Visit | Admitting: Specialist

## 2011-12-10 ENCOUNTER — Encounter (HOSPITAL_BASED_OUTPATIENT_CLINIC_OR_DEPARTMENT_OTHER): Admission: RE | Payer: Self-pay | Source: Ambulatory Visit

## 2011-12-10 SURGERY — EXCISION, HIDRADENITIS, AXILLA
Anesthesia: General | Laterality: Right

## 2012-01-01 ENCOUNTER — Encounter (HOSPITAL_BASED_OUTPATIENT_CLINIC_OR_DEPARTMENT_OTHER): Payer: Self-pay | Admitting: *Deleted

## 2012-01-07 ENCOUNTER — Ambulatory Visit (HOSPITAL_BASED_OUTPATIENT_CLINIC_OR_DEPARTMENT_OTHER): Payer: BC Managed Care – PPO | Admitting: Anesthesiology

## 2012-01-07 ENCOUNTER — Encounter (HOSPITAL_BASED_OUTPATIENT_CLINIC_OR_DEPARTMENT_OTHER)
Admission: RE | Disposition: A | Discharge status: Home / Self Care | Payer: Self-pay | Source: Ambulatory Visit | Attending: Specialist

## 2012-01-07 ENCOUNTER — Encounter (HOSPITAL_BASED_OUTPATIENT_CLINIC_OR_DEPARTMENT_OTHER): Payer: Self-pay | Admitting: Anesthesiology

## 2012-01-07 ENCOUNTER — Ambulatory Visit (HOSPITAL_BASED_OUTPATIENT_CLINIC_OR_DEPARTMENT_OTHER)
Admission: RE | Admit: 2012-01-07 | Discharge: 2012-01-07 | Disposition: A | Discharge status: Home / Self Care | Payer: BC Managed Care – PPO | Source: Ambulatory Visit | Attending: Specialist | Admitting: Specialist

## 2012-01-07 ENCOUNTER — Encounter (HOSPITAL_BASED_OUTPATIENT_CLINIC_OR_DEPARTMENT_OTHER): Payer: Self-pay | Admitting: *Deleted

## 2012-01-07 DIAGNOSIS — R51 Headache: Secondary | ICD-10-CM | POA: Insufficient documentation

## 2012-01-07 DIAGNOSIS — L732 Hidradenitis suppurativa: Secondary | ICD-10-CM | POA: Insufficient documentation

## 2012-01-07 HISTORY — PX: HYDRADENITIS EXCISION: SHX5243

## 2012-01-07 SURGERY — EXCISION, HIDRADENITIS, AXILLA
Anesthesia: General | Site: Axilla | Wound class: Clean Contaminated

## 2012-01-07 MED ORDER — ACETAMINOPHEN 10 MG/ML IV SOLN
1000.0000 mg | Freq: Once | INTRAVENOUS | Status: AC
  Administered 2012-01-07: 1000 mg via INTRAVENOUS

## 2012-01-07 MED ORDER — PROPOFOL 10 MG/ML IV EMUL: INTRAVENOUS | Status: DC | PRN

## 2012-01-07 MED ORDER — FENTANYL CITRATE 0.05 MG/ML IJ SOLN
INTRAMUSCULAR | Status: DC | PRN
  Administered 2012-01-07: 100 ug via INTRAVENOUS
  Administered 2012-01-07: 25 ug via INTRAVENOUS

## 2012-01-07 MED ORDER — OXYCODONE HCL 5 MG PO TABS: 5.0000 mg | ORAL_TABLET | Freq: Once | ORAL | Status: DC | PRN

## 2012-01-07 MED ORDER — MIDAZOLAM HCL 5 MG/5ML IJ SOLN
INTRAMUSCULAR | Status: DC | PRN
  Administered 2012-01-07: 2 mg via INTRAVENOUS

## 2012-01-07 MED ORDER — DEXAMETHASONE SODIUM PHOSPHATE 4 MG/ML IJ SOLN
INTRAMUSCULAR | Status: DC | PRN
  Administered 2012-01-07: 10 mg via INTRAVENOUS

## 2012-01-07 MED ORDER — HYDROMORPHONE HCL PF 1 MG/ML IJ SOLN: 0.2500 mg | INTRAMUSCULAR | Status: DC | PRN

## 2012-01-07 MED ORDER — SODIUM CHLORIDE 0.9 % IV SOLN
INTRAVENOUS | Status: DC | PRN
  Administered 2012-01-07: 11:00:00 via INTRAMUSCULAR

## 2012-01-07 MED ORDER — LACTATED RINGERS IV SOLN
INTRAVENOUS | Status: DC
  Administered 2012-01-07: 10:00:00 via INTRAVENOUS

## 2012-01-07 MED ORDER — METOCLOPRAMIDE HCL 5 MG/ML IJ SOLN
10.0000 mg | Freq: Once | INTRAMUSCULAR | Status: AC | PRN
  Administered 2012-01-07: 10 mg via INTRAVENOUS

## 2012-01-07 MED ORDER — CEFAZOLIN SODIUM-DEXTROSE 2-3 GM-% IV SOLR: 2.0000 g | INTRAVENOUS | Status: DC

## 2012-01-07 MED ORDER — SCOPOLAMINE 1 MG/3DAYS TD PT72
1.0000 | MEDICATED_PATCH | Freq: Once | TRANSDERMAL | Status: DC
  Administered 2012-01-07: 1.5 mg via TRANSDERMAL

## 2012-01-07 MED ORDER — LIDOCAINE HCL (CARDIAC) 20 MG/ML IV SOLN
INTRAVENOUS | Status: DC | PRN
  Administered 2012-01-07: 50 mg via INTRAVENOUS

## 2012-01-07 MED ORDER — ONDANSETRON HCL 4 MG/2ML IJ SOLN
INTRAMUSCULAR | Status: DC | PRN
  Administered 2012-01-07: 4 mg via INTRAVENOUS

## 2012-01-07 MED ORDER — PROPOFOL 10 MG/ML IV EMUL
INTRAVENOUS | Status: DC | PRN
  Administered 2012-01-07: 200 mg via INTRAVENOUS

## 2012-01-07 MED ORDER — OXYCODONE HCL 5 MG/5ML PO SOLN: 5.0000 mg | Freq: Once | ORAL | Status: DC | PRN

## 2012-01-07 SURGICAL SUPPLY — 61 items
APL SKNCLS STERI-STRIP NONHPOA (GAUZE/BANDAGES/DRESSINGS)
BAG DECANTER FOR FLEXI CONT (MISCELLANEOUS) ×2 IMPLANT
BENZOIN TINCTURE PRP APPL 2/3 (GAUZE/BANDAGES/DRESSINGS) IMPLANT
BLADE KNIFE PERSONA 10 (BLADE) ×2 IMPLANT
BLADE KNIFE PERSONA 15 (BLADE) ×2 IMPLANT
BNDG COHESIVE 4X5 TAN STRL (GAUZE/BANDAGES/DRESSINGS) IMPLANT
BRIEF STRETCH FOR OB PAD LRG (UNDERPADS AND DIAPERS) IMPLANT
CANISTER SUCTION 1200CC (MISCELLANEOUS) ×2 IMPLANT
CLOTH BEACON ORANGE TIMEOUT ST (SAFETY) ×2 IMPLANT
COVER MAYO STAND STRL (DRAPES) ×4 IMPLANT
COVER TABLE BACK 60X90 (DRAPES) ×2 IMPLANT
DECANTER SPIKE VIAL GLASS SM (MISCELLANEOUS) IMPLANT
DRAIN CHANNEL 10M FLAT 3/4 FLT (DRAIN) IMPLANT
DRAIN CHANNEL 7F FF FLAT (WOUND CARE) IMPLANT
DRAPE EXTREMITY T 121X128X90 (DRAPE) IMPLANT
DRAPE U-SHAPE 76X120 STRL (DRAPES) ×4 IMPLANT
DRESSING TELFA 8X3 (GAUZE/BANDAGES/DRESSINGS) IMPLANT
DRSG PAD ABDOMINAL 8X10 ST (GAUZE/BANDAGES/DRESSINGS) ×2 IMPLANT
ELECT REM PT RETURN 9FT ADLT (ELECTROSURGICAL) ×2
ELECTRODE REM PT RTRN 9FT ADLT (ELECTROSURGICAL) ×1 IMPLANT
EVACUATOR SILICONE 100CC (DRAIN) IMPLANT
FILTER 7/8 IN (FILTER) ×2 IMPLANT
GAUZE XEROFORM 5X9 LF (GAUZE/BANDAGES/DRESSINGS) ×2 IMPLANT
GLOVE BIOGEL M STRL SZ7.5 (GLOVE) ×2 IMPLANT
GLOVE BIOGEL PI IND STRL 8 (GLOVE) ×1 IMPLANT
GLOVE BIOGEL PI INDICATOR 8 (GLOVE) ×1
GLOVE ECLIPSE 7.0 STRL STRAW (GLOVE) ×4 IMPLANT
GOWN PREVENTION PLUS XLARGE (GOWN DISPOSABLE) IMPLANT
GOWN PREVENTION PLUS XXLARGE (GOWN DISPOSABLE) ×4 IMPLANT
IV NS 500ML (IV SOLUTION) ×2
IV NS 500ML BAXH (IV SOLUTION) ×1 IMPLANT
NDL SAFETY ECLIPSE 18X1.5 (NEEDLE) IMPLANT
NEEDLE HYPO 18GX1.5 SHARP (NEEDLE)
NEEDLE HYPO 25X1 1.5 SAFETY (NEEDLE) IMPLANT
NEEDLE SPNL 18GX3.5 QUINCKE PK (NEEDLE) ×2 IMPLANT
NS IRRIG 1000ML POUR BTL (IV SOLUTION) ×2 IMPLANT
PACK BASIN DAY SURGERY FS (CUSTOM PROCEDURE TRAY) ×2 IMPLANT
PACK LITHOTOMY IV (CUSTOM PROCEDURE TRAY) IMPLANT
PEN SKIN MARKING BROAD TIP (MISCELLANEOUS) ×2 IMPLANT
PIN SAFETY STERILE (MISCELLANEOUS) IMPLANT
SHEET MEDIUM DRAPE 40X70 STRL (DRAPES) IMPLANT
SPONGE GAUZE 4X4 12PLY (GAUZE/BANDAGES/DRESSINGS) ×4 IMPLANT
SPONGE LAP 18X18 X RAY DECT (DISPOSABLE) ×4 IMPLANT
STAPLER VISISTAT 35W (STAPLE) IMPLANT
STOCKINETTE IMPERVIOUS LG (DRAPES) ×2 IMPLANT
STRIP SUTURE WOUND CLOSURE 1/2 (SUTURE) IMPLANT
SUT ETHILON 3 0 FSL (SUTURE) ×2 IMPLANT
SUT MNCRL AB 3-0 PS2 18 (SUTURE) ×2 IMPLANT
SUT MON AB 2-0 CT1 36 (SUTURE) ×2 IMPLANT
SUT PROLENE 4 0 P 3 18 (SUTURE) IMPLANT
SYR 20CC LL (SYRINGE) IMPLANT
SYR 50ML LL SCALE MARK (SYRINGE) ×4 IMPLANT
SYR 5ML LL (SYRINGE) IMPLANT
SYR CONTROL 10ML LL (SYRINGE) IMPLANT
TOWEL OR 17X24 6PK STRL BLUE (TOWEL DISPOSABLE) ×6 IMPLANT
TRAY DSU PREP LF (CUSTOM PROCEDURE TRAY) ×2 IMPLANT
TUBE CONNECTING 20X1/4 (TUBING) ×2 IMPLANT
UNDERPAD 30X30 INCONTINENT (UNDERPADS AND DIAPERS) ×4 IMPLANT
VAC PENCILS W/TUBING CLEAR (MISCELLANEOUS) ×2 IMPLANT
WATER STERILE IRR 1000ML POUR (IV SOLUTION) ×2 IMPLANT
YANKAUER SUCT BULB TIP NO VENT (SUCTIONS) ×2 IMPLANT

## 2012-01-07 NOTE — Anesthesia Postprocedure Evaluation (Signed)
Anesthesia Post Note  Patient: Teresa [name]  Procedure(s) Performed: Procedure(s) (LRB): EXCISION HYDRADENITIS AXILLA (N/A)  Anesthesia type: General  Patient location: PACU  Post pain: Pain level controlled  Post assessment: Patient's Cardiovascular Status Stable  Last Vitals:  Filed Vitals:   01/07/12 1245  BP: 125/70  Pulse: 82  Temp: 36.7 C  Resp: 16    Post vital signs: Reviewed and stable  Level of consciousness: alert  Complications: No apparent anesthesia complications

## 2012-01-07 NOTE — Anesthesia Preprocedure Evaluation (Signed)
Anesthesia Evaluation  Patient identified by MRN, date of birth, ID band Patient awake    Reviewed: Allergy & Precautions, H&P , NPO status , Patient's Chart, lab work & pertinent test results, reviewed documented beta blocker date and time   History of Anesthesia Complications (+) PONV  Airway Mallampati: II TM Distance: >3 FB Neck ROM: full    Dental   Pulmonary neg pulmonary ROS,  breath sounds clear to auscultation        Cardiovascular negative cardio ROS  Rhythm:regular     Neuro/Psych  Headaches, negative psych ROS   GI/Hepatic negative GI ROS, Neg liver ROS,   Endo/Other  negative endocrine ROS  Renal/GU negative Renal ROS  negative genitourinary   Musculoskeletal   Abdominal   Peds  Hematology negative hematology ROS (+)   Anesthesia Other Findings See surgeon's H&P   Reproductive/Obstetrics negative OB ROS                           Anesthesia Physical Anesthesia Plan  ASA: II  Anesthesia Plan: General   Post-op Pain Management:    Induction: Intravenous  Airway Management Planned: LMA  Additional Equipment:   Intra-op Plan:   Post-operative Plan: Extubation in OR  Informed Consent: I have reviewed the patients History and Physical, chart, labs and discussed the procedure including the risks, benefits and alternatives for the proposed anesthesia with the patient or authorized representative who has indicated his/her understanding and acceptance.   Dental Advisory Given  Plan Discussed with: CRNA and Surgeon  Anesthesia Plan Comments:         Anesthesia Quick Evaluation

## 2012-01-07 NOTE — Transfer of Care (Signed)
Immediate Anesthesia Transfer of Care Note  Patient: Teresa [name]  Procedure(s) Performed: Procedure(s) (LRB): EXCISION HYDRADENITIS AXILLA (N/A)  Patient Location: PACU  Anesthesia Type: General  Level of Consciousness: awake and alert   Airway & Oxygen Therapy: Patient Spontanous Breathing and Patient connected to face mask oxygen  Post-op Assessment: Report given to PACU RN and Post -op Vital signs reviewed and stable  Post vital signs: Reviewed and stable  Complications: No apparent anesthesia complications

## 2012-01-07 NOTE — H&P (Signed)
Teresa [name] is an 36 y.o. female.   Chief Complaint: Drainage right axilla HPI:Hx of hidraidnitis suppurativa  Past Medical History  Diagnosis Date  . PONV (postoperative nausea and vomiting)   . Headache     migraines  . Right axillary hidradenitis     has an open area right axilla  . Anemia     no current med. - had iron transfusion 10/2011    Past Surgical History  Procedure Date  . Axillary hidradenitis excision 02/12/2011    left  . Cesarean section 12/31/2004  . Laparoscopy 03/15/2000    destruction of hydatid of Morgagni    History reviewed. No pertinent family history. Social History:  reports that she has never smoked. She has never used smokeless tobacco. She reports that she does not drink alcohol or use illicit drugs.  Allergies:  Allergies  Allergen Reactions  . Adhesive (Tape) Rash    Medications Prior to Admission  Medication Sig Dispense Refill  . HYDROcodone-acetaminophen (NORCO/VICODIN) 5-325 MG per tablet Take 1 tablet by mouth every 6 (six) hours as needed.      . promethazine (PHENERGAN) 25 MG tablet Take 25 mg by mouth every 6 (six) hours as needed.      . SUMAtriptan (IMITREX) 25 MG tablet Take 25 mg by mouth every 2 (two) hours as needed.        Results for orders placed during the hospital encounter of 01/07/12 (from the past 48 hour(s))  POCT HEMOGLOBIN-HEMACUE     Status: Abnormal   Collection Time   01/07/12  9:43 AM      Component Value Range Comment   Hemoglobin 11.7 (*) 12.0 - 15.0 g/dL    No results found.  Review of Systems  Constitutional: Negative.   HENT: Negative.   Eyes: Negative.   Respiratory: Negative.   Cardiovascular: Negative.   Gastrointestinal: Positive for heartburn and nausea.  Genitourinary: Negative.   Musculoskeletal: Negative.   Skin: Positive for rash.  Neurological: Negative.   Endo/Heme/Allergies: Negative.   Psychiatric/Behavioral: Negative.     Blood pressure 117/79, pulse 90, temperature 97.9 F  (36.6 C), temperature source Oral, resp. rate 16, height 5\' 5"  (1.651 m), weight 73.483 kg (162 lb), last menstrual period 12/28/2011, SpO2 99.00%. Physical Exam   Assessment/Plan Hidraeidnitis / Estill Batten closure  Ketzia Guzek L 01/07/2012, 10:02 AM

## 2012-01-07 NOTE — Anesthesia Procedure Notes (Signed)
Procedure Name: LMA Insertion Performed by: Barbie Croston W Pre-anesthesia Checklist: Patient identified, Timeout performed, Emergency Drugs available, Suction available and Patient being monitored Patient Re-evaluated:Patient Re-evaluated prior to inductionOxygen Delivery Method: Circle system utilized Preoxygenation: Pre-oxygenation with 100% oxygen Intubation Type: IV induction Ventilation: Mask ventilation without difficulty LMA: LMA inserted LMA Size: 4.0 Number of attempts: 1 Placement Confirmation: breath sounds checked- equal and bilateral and positive ETCO2 Tube secured with: Tape Dental Injury: Teeth and Oropharynx as per pre-operative assessment      

## 2012-01-07 NOTE — Brief Op Note (Signed)
01/07/2012  11:18 AM  PATIENT:  Teresa [name]  36 y.o. female  PRE-OPERATIVE DIAGNOSIS:  hidradenitis  POST-OPERATIVE DIAGNOSIS:  hidradenitis  PROCEDURE:  Procedure(s) (LRB): EXCISION HYDRADENITIS AXILLA (N/A)  SURGEON:  Surgeon(s) and Role:    * Louisa Second, MD - Primary  PHYSICIAN ASSISTANT:   ASSISTANTS: none   ANESTHESIA:   general  EBL:  Total I/O In: 1000 [I.V.:1000] Out: -   BLOOD ADMINISTERED:none  DRAINS: none   LOCAL MEDICATIONS USED:  LIDOCAINE   SPECIMEN:  Excision  DISPOSITION OF SPECIMEN:  PATHOLOGY  COUNTS:  YES  TOURNIQUET:  * No tourniquets in log *  DICTATION: .Other Dictation: Dictation Number [id number]  PLAN OF CARE: Discharge to home after PACU  PATIENT DISPOSITION:  PACU - hemodynamically stable.   Delay start of Pharmacological VTE agent (>24hrs) due to surgical blood loss or risk of bleeding: not applicable 2

## 2012-01-08 ENCOUNTER — Encounter (HOSPITAL_BASED_OUTPATIENT_CLINIC_OR_DEPARTMENT_OTHER): Payer: Self-pay | Admitting: Specialist

## 2012-01-09 NOTE — Op Note (Signed)
NAMEBLESSYN, SOMMERVILLE                  ACCOUNT NO.:  [id number]  MEDICAL RECORD NO.:  [id number]  LOCATION:                                 FACILITY:  PHYSICIAN:  Earvin Hansen L. Jamelah Sitzer, M.D.DATE OF BIRTH:  [birth year]  DATE OF PROCEDURE:  01/07/2012 DATE OF DISCHARGE:  01/07/2012                              OPERATIVE REPORT   This patient has a history of hidradenitis suppurativa involving the right axillary region with increased pus pockets, pain, discomfort in the sinus tracts, tremendous drainage.  PROCEDURE:  Planned excision of the above with Ryan-Pollock closure advancement flap.  ANESTHESIA:  General.  DESCRIPTION OF PROCEDURE:  The patient was taken to the operating room, placed on the operating room table in the supine position, was given adequate general anesthesia, intubated orally.  Prep was done to the chest, breasts, axillary, arm areas on the right side.  Using Hibiclens soap and solution, walled off with sterile towels and drapes so as to make a sterile field.  The areas were injected with 0.25% Xylocaine With Epinephrine, 1:400, 000 concentration, a total of 150 mL.  This was allowed to set up.  Excision was made of the area of the hair-bearing regions, large elliptical pattern with a cutting on 30, down to the underlying superficial fascia.  Proper hemostasis was maintained with the Bovie and coagulation.  Flaps were transposed and stayed and then dissection was carried of the whole pocket and a flap design all the way through and removal of diseased tissue.  After proper hemostasis, the lateral and medial edges of these tissues were freed up another 2.5 to 3 cm and then advancement flap closure was done with multiple sutures of 2- 0 Monocryl to the deep subcutaneous tissue and fascia, subcutaneous tissue, subdermal suture of 3-0 Monocryl, and a running subcuticular stitch of 3-0 Monocryl.  Half-inch Steri-Strips and soft dressing applied to all areas.  She withstood  the procedures very well and was taken to recovery in excellent condition.  ESTIMATED BLOOD LOSS:  Less than 150 mL.  COMPLICATIONS:  None.     Yaakov Guthrie. Shon Hough, M.D.     Cathie Hoops  D:  01/07/2012  T:  01/08/2012  Job:  [id number]

## 2012-02-26 ENCOUNTER — Ambulatory Visit: Payer: BC Managed Care – PPO | Admitting: Oncology

## 2012-02-26 ENCOUNTER — Other Ambulatory Visit: Payer: BC Managed Care – PPO | Admitting: Lab

## 2012-09-22 ENCOUNTER — Other Ambulatory Visit: Payer: Self-pay | Admitting: Obstetrics and Gynecology

## 2013-02-05 ENCOUNTER — Other Ambulatory Visit: Payer: Self-pay | Admitting: Obstetrics and Gynecology

## 2013-02-05 DIAGNOSIS — N644 Mastodynia: Secondary | ICD-10-CM

## 2014-05-27 ENCOUNTER — Encounter (HOSPITAL_BASED_OUTPATIENT_CLINIC_OR_DEPARTMENT_OTHER): Payer: Self-pay | Admitting: Specialist

## 2016-10-09 DIAGNOSIS — Z98891 History of uterine scar from previous surgery: Secondary | ICD-10-CM | POA: Insufficient documentation

## 2016-10-10 ENCOUNTER — Encounter (HOSPITAL_COMMUNITY): Payer: Self-pay | Admitting: *Deleted

## 2016-10-10 ENCOUNTER — Observation Stay (HOSPITAL_COMMUNITY)
Admission: AD | Admit: 2016-10-10 | Discharge: 2016-10-11 | Disposition: A | Discharge status: Home / Self Care | Payer: Medicaid Other | Source: Ambulatory Visit | Attending: Obstetrics and Gynecology | Admitting: Obstetrics and Gynecology

## 2016-10-10 DIAGNOSIS — N6452 Nipple discharge: Principal | ICD-10-CM | POA: Insufficient documentation

## 2016-10-10 DIAGNOSIS — Z9289 Personal history of other medical treatment: Secondary | ICD-10-CM

## 2016-10-10 DIAGNOSIS — D649 Anemia, unspecified: Secondary | ICD-10-CM | POA: Insufficient documentation

## 2016-10-10 DIAGNOSIS — N920 Excessive and frequent menstruation with regular cycle: Secondary | ICD-10-CM | POA: Diagnosis present

## 2016-10-10 HISTORY — DX: Personal history of other medical treatment: Z92.89

## 2016-10-10 LAB — PREPARE RBC (CROSSMATCH)

## 2016-10-10 LAB — ABO/RH: ABO/RH(D): O POS

## 2016-10-10 MED ORDER — FUROSEMIDE 10 MG/ML IJ SOLN
20.0000 mg | Freq: Once | INTRAMUSCULAR | Status: AC
  Administered 2016-10-10: 20 mg via INTRAVENOUS
  Filled 2016-10-10: qty 2

## 2016-10-10 MED ORDER — ACETAMINOPHEN 325 MG PO TABS
650.0000 mg | ORAL_TABLET | Freq: Once | ORAL | Status: AC
  Administered 2016-10-10: 650 mg via ORAL
  Filled 2016-10-10: qty 2

## 2016-10-10 MED ORDER — DIPHENHYDRAMINE HCL 25 MG PO CAPS
25.0000 mg | ORAL_CAPSULE | Freq: Once | ORAL | Status: AC
  Administered 2016-10-10: 25 mg via ORAL
  Filled 2016-10-10: qty 1

## 2016-10-10 MED ORDER — ACETAMINOPHEN 500 MG PO TABS
1000.0000 mg | ORAL_TABLET | Freq: Once | ORAL | Status: AC
  Administered 2016-10-10: 1000 mg via ORAL
  Filled 2016-10-10: qty 2

## 2016-10-10 MED ORDER — SODIUM CHLORIDE 0.9 % IV SOLN
Freq: Once | INTRAVENOUS | Status: AC
  Administered 2016-10-10: 13:00:00 via INTRAVENOUS

## 2016-10-10 NOTE — H&P (Signed)
Teresa [name] is an 41 y.o. G31P1001 black female who presents for a blood transfusion. She is symptomatic with a recent hgb of 4.4. She has a h.o. Menorrhagia which is believed to be the source of her blood loss. She has a planned surgery to tx this in the future. Chief Complaint: HPI:  Past Medical History:  Diagnosis Date  . Anemia    no current med. - had iron transfusion 10/2011  . Headache(784.0)    migraines  . PONV (postoperative nausea and vomiting)   . Right axillary hidradenitis    has an open area right axilla    Past Surgical History:  Procedure Laterality Date  . AXILLARY HIDRADENITIS EXCISION  02/12/2011   left  . CESAREAN SECTION  12/31/2004  . HYDRADENITIS EXCISION  01/07/2012   Procedure: EXCISION HYDRADENITIS AXILLA;  Surgeon: Cristine Polio, MD;  Location: [facility];  Service: Plastics;  Laterality: N/A;  excision hydrradenitis right axilla with ryan pollock closure  . LAPAROSCOPY  03/15/2000   destruction of hydatid of Morgagni    History reviewed. No pertinent family history. Social History:  reports that she has never smoked. She has never used smokeless tobacco. She reports that she does not drink alcohol or use drugs.  Allergies:  Allergies  Allergen Reactions  . Adhesive [Tape] Rash    Medications Prior to Admission  Medication Sig Dispense Refill  . Ibuprofen-Diphenhydramine HCl (IBUPROFEN PM) 200-25 MG CAPS Take 3-4 tablets by mouth at bedtime as needed (for pain/sleep).         Blood pressure 115/64, pulse 92, temperature 98.3 F (36.8 C), temperature source Oral, resp. rate 18, height 5\' 5"  (1.651 m), weight 148 lb (67.1 kg), last menstrual period 09/20/2016, SpO2 100 %. General appearance: alert and cooperative Lungs: clear to auscultation bilaterally Abdomen: soft, non-tender; bowel sounds normal; no masses,  no organomegaly   Lab Results  Component Value Date   WBC 6.4 11/21/2011   HGB 11.7 (L) 01/07/2012   HCT 27.5 (L)  11/21/2011   MCV 58.9 (L) 11/21/2011   PLT 217 11/21/2011   Lab Results  Component Value Date   PREGTESTUR NEGATIVE 02/07/2011      Patient Active Problem List   Diagnosis Date Noted  . Anemia 11/21/2011   IMP/ Severe anemia         Menorrhagia Plan/transfuse 4 units of prbcs  Teresa [name] E 10/10/2016, 2:21 PM

## 2016-10-11 ENCOUNTER — Other Ambulatory Visit: Payer: Self-pay | Admitting: Obstetrics and Gynecology

## 2016-10-11 DIAGNOSIS — N6452 Nipple discharge: Secondary | ICD-10-CM | POA: Diagnosis not present

## 2016-10-11 LAB — TYPE AND SCREEN
ABO/RH(D): O POS
Antibody Screen: NEGATIVE
UNIT DIVISION: 0
Unit division: 0
Unit division: 0
Unit division: 0

## 2016-10-11 LAB — BPAM RBC
BLOOD PRODUCT EXPIRATION DATE: 201806202359
Blood Product Expiration Date: 201806142359
Blood Product Expiration Date: 201806142359
Blood Product Expiration Date: 201806192359
ISSUE DATE / TIME: 201805301827
ISSUE DATE / TIME: 201805302057
ISSUE DATE / TIME: 201805301250
ISSUE DATE / TIME: 201805301528
UNIT TYPE AND RH: 5100
Unit Type and Rh: 5100
Unit Type and Rh: 5100
Unit Type and Rh: 5100

## 2016-10-11 NOTE — Progress Notes (Signed)
Patient discharged to home, self care. Discharge instructions reviewed with both patient and significant other, both verbalized understanding.

## 2016-10-12 ENCOUNTER — Encounter (HOSPITAL_BASED_OUTPATIENT_CLINIC_OR_DEPARTMENT_OTHER): Payer: Self-pay | Admitting: *Deleted

## 2016-10-13 NOTE — Discharge Summary (Signed)
Pt was admitted for severe symptomatic anemia which is felt to be secondary to menorrhagia. She was given 4 units of PRBCs. Her initial hgb was 4.4. She has a known 3cm submucous fibroid . She has surgery scheduled in 10 days to tx this. She will return to the office in one week

## 2016-10-15 ENCOUNTER — Inpatient Hospital Stay
Admit: 2016-10-15 | Discharge: 2016-10-15 | Disposition: A | Discharge status: Home / Self Care | Payer: Medicaid Other | Attending: Obstetrics and Gynecology | Admitting: Obstetrics and Gynecology

## 2016-10-15 ENCOUNTER — Other Ambulatory Visit: Payer: Self-pay | Admitting: Obstetrics and Gynecology

## 2016-10-15 ENCOUNTER — Other Ambulatory Visit: Payer: Medicaid Other

## 2016-10-15 ENCOUNTER — Encounter (HOSPITAL_BASED_OUTPATIENT_CLINIC_OR_DEPARTMENT_OTHER): Payer: Self-pay | Admitting: *Deleted

## 2016-10-15 ENCOUNTER — Ambulatory Visit
Admission: RE | Admit: 2016-10-15 | Discharge: 2016-10-15 | Disposition: A | Discharge status: Home / Self Care | Payer: Medicaid Other | Source: Ambulatory Visit | Attending: Obstetrics and Gynecology | Admitting: Obstetrics and Gynecology

## 2016-10-15 DIAGNOSIS — N6452 Nipple discharge: Secondary | ICD-10-CM

## 2016-10-15 DIAGNOSIS — N6310 Unspecified lump in the right breast, unspecified quadrant: Secondary | ICD-10-CM

## 2016-10-15 HISTORY — DX: Nipple discharge: N64.52

## 2016-10-15 NOTE — Progress Notes (Signed)
NPO AFTER MN.  ARRIVE AT 1030.  NEEDS URINE PREG.  GETTING CBC DONE Tuesday 10-16-2016.

## 2016-10-16 ENCOUNTER — Other Ambulatory Visit: Payer: Self-pay | Admitting: Obstetrics and Gynecology

## 2016-10-16 ENCOUNTER — Encounter (HOSPITAL_BASED_OUTPATIENT_CLINIC_OR_DEPARTMENT_OTHER): Payer: Self-pay | Admitting: *Deleted

## 2016-10-16 DIAGNOSIS — N6310 Unspecified lump in the right breast, unspecified quadrant: Secondary | ICD-10-CM

## 2016-10-16 DIAGNOSIS — N6452 Nipple discharge: Secondary | ICD-10-CM

## 2016-10-17 ENCOUNTER — Ambulatory Visit
Admission: RE | Admit: 2016-10-17 | Discharge: 2016-10-17 | Disposition: A | Discharge status: Home / Self Care | Payer: Medicaid Other | Source: Ambulatory Visit | Attending: Obstetrics and Gynecology | Admitting: Obstetrics and Gynecology

## 2016-10-17 DIAGNOSIS — N6310 Unspecified lump in the right breast, unspecified quadrant: Secondary | ICD-10-CM

## 2016-10-17 DIAGNOSIS — N6452 Nipple discharge: Secondary | ICD-10-CM

## 2016-10-23 ENCOUNTER — Encounter (HOSPITAL_COMMUNITY): Payer: Self-pay

## 2016-10-23 ENCOUNTER — Encounter (HOSPITAL_COMMUNITY)
Admission: RE | Disposition: A | Discharge status: Home / Self Care | Payer: Self-pay | Source: Ambulatory Visit | Attending: Obstetrics and Gynecology

## 2016-10-23 ENCOUNTER — Ambulatory Visit (HOSPITAL_COMMUNITY): Payer: Medicaid Other | Admitting: Anesthesiology

## 2016-10-23 ENCOUNTER — Ambulatory Visit (HOSPITAL_BASED_OUTPATIENT_CLINIC_OR_DEPARTMENT_OTHER)
Admission: RE | Admit: 2016-10-23 | Discharge: 2016-10-23 | Disposition: A | Discharge status: Home / Self Care | Payer: Medicaid Other | Source: Ambulatory Visit | Attending: Obstetrics and Gynecology | Admitting: Obstetrics and Gynecology

## 2016-10-23 DIAGNOSIS — D250 Submucous leiomyoma of uterus: Secondary | ICD-10-CM | POA: Diagnosis present

## 2016-10-23 DIAGNOSIS — Z79899 Other long term (current) drug therapy: Secondary | ICD-10-CM | POA: Diagnosis not present

## 2016-10-23 DIAGNOSIS — N920 Excessive and frequent menstruation with regular cycle: Secondary | ICD-10-CM | POA: Insufficient documentation

## 2016-10-23 DIAGNOSIS — Z803 Family history of malignant neoplasm of breast: Secondary | ICD-10-CM | POA: Insufficient documentation

## 2016-10-23 DIAGNOSIS — D649 Anemia, unspecified: Secondary | ICD-10-CM | POA: Insufficient documentation

## 2016-10-23 DIAGNOSIS — Z9889 Other specified postprocedural states: Secondary | ICD-10-CM | POA: Insufficient documentation

## 2016-10-23 HISTORY — DX: Personal history of other medical treatment: Z92.89

## 2016-10-23 HISTORY — PX: DILATATION & CURETTAGE/HYSTEROSCOPY WITH MYOSURE: SHX6511

## 2016-10-23 HISTORY — DX: Anemia, unspecified: D64.9

## 2016-10-23 HISTORY — DX: Submucous leiomyoma of uterus: D25.0

## 2016-10-23 HISTORY — DX: Migraine, unspecified, not intractable, without status migrainosus: G43.909

## 2016-10-23 HISTORY — DX: Excessive and frequent menstruation with regular cycle: N92.0

## 2016-10-23 LAB — CBC
HCT: 33.4 % — ABNORMAL LOW (ref 36.0–46.0)
Hemoglobin: 9.9 g/dL — ABNORMAL LOW (ref 12.0–15.0)
MCH: 20.3 pg — ABNORMAL LOW (ref 26.0–34.0)
MCHC: 29.6 g/dL — AB (ref 30.0–36.0)
MCV: 68.6 fL — ABNORMAL LOW (ref 78.0–100.0)
Platelets: 117 10*3/uL — ABNORMAL LOW (ref 150–400)
RBC: 4.87 MIL/uL (ref 3.87–5.11)
WBC: 4.6 10*3/uL (ref 4.0–10.5)

## 2016-10-23 LAB — PREGNANCY, URINE: PREG TEST UR: NEGATIVE

## 2016-10-23 SURGERY — DILATATION & CURETTAGE/HYSTEROSCOPY WITH MYOSURE
Anesthesia: General

## 2016-10-23 MED ORDER — PROMETHAZINE HCL 25 MG/ML IJ SOLN
INTRAMUSCULAR | Status: AC
  Administered 2016-10-23: 6.25 mg via INTRAVENOUS
  Filled 2016-10-23: qty 1

## 2016-10-23 MED ORDER — OXYCODONE HCL 5 MG/5ML PO SOLN: 5.0000 mg | Freq: Once | ORAL | Status: DC | PRN

## 2016-10-23 MED ORDER — LIDOCAINE HCL 1 % IJ SOLN
INTRAMUSCULAR | Status: DC | PRN
  Administered 2016-10-23: 10 mL

## 2016-10-23 MED ORDER — HYDROMORPHONE HCL 1 MG/ML IJ SOLN: 0.2500 mg | INTRAMUSCULAR | Status: DC | PRN

## 2016-10-23 MED ORDER — LACTATED RINGERS IV SOLN
INTRAVENOUS | Status: DC
  Administered 2016-10-23: 12:00:00 via INTRAVENOUS

## 2016-10-23 MED ORDER — MIDAZOLAM HCL 2 MG/2ML IJ SOLN
INTRAMUSCULAR | Status: AC
  Filled 2016-10-23: qty 2

## 2016-10-23 MED ORDER — SCOPOLAMINE 1 MG/3DAYS TD PT72
1.0000 | MEDICATED_PATCH | Freq: Once | TRANSDERMAL | Status: DC
  Administered 2016-10-23: 1.5 mg via TRANSDERMAL

## 2016-10-23 MED ORDER — ONDANSETRON HCL 4 MG/2ML IJ SOLN
INTRAMUSCULAR | Status: DC | PRN
  Administered 2016-10-23: 4 mg via INTRAVENOUS

## 2016-10-23 MED ORDER — LIDOCAINE HCL (CARDIAC) 20 MG/ML IV SOLN
INTRAVENOUS | Status: DC | PRN
  Administered 2016-10-23: 80 mg via INTRAVENOUS

## 2016-10-23 MED ORDER — SODIUM CHLORIDE 0.9 % IR SOLN
Status: DC | PRN
  Administered 2016-10-23 (×2): 3000 mL

## 2016-10-23 MED ORDER — CEFAZOLIN SODIUM-DEXTROSE 2-3 GM-% IV SOLR
INTRAVENOUS | Status: DC | PRN
  Administered 2016-10-23: 2 g via INTRAVENOUS

## 2016-10-23 MED ORDER — ROCURONIUM BROMIDE 100 MG/10ML IV SOLN
INTRAVENOUS | Status: AC
  Filled 2016-10-23: qty 1

## 2016-10-23 MED ORDER — PROPOFOL 10 MG/ML IV BOLUS
INTRAVENOUS | Status: DC | PRN
  Administered 2016-10-23: 180 mg via INTRAVENOUS

## 2016-10-23 MED ORDER — SCOPOLAMINE 1 MG/3DAYS TD PT72
MEDICATED_PATCH | TRANSDERMAL | Status: AC
  Administered 2016-10-23: 1.5 mg via TRANSDERMAL
  Filled 2016-10-23: qty 1

## 2016-10-23 MED ORDER — DEXAMETHASONE SODIUM PHOSPHATE 10 MG/ML IJ SOLN
INTRAMUSCULAR | Status: DC | PRN
  Administered 2016-10-23: 10 mg via INTRAVENOUS

## 2016-10-23 MED ORDER — METOCLOPRAMIDE HCL 5 MG/ML IJ SOLN
10.0000 mg | Freq: Once | INTRAMUSCULAR | Status: AC
  Administered 2016-10-23: 10 mg via INTRAVENOUS

## 2016-10-23 MED ORDER — PROMETHAZINE HCL 25 MG/ML IJ SOLN
6.2500 mg | INTRAMUSCULAR | Status: AC | PRN
  Administered 2016-10-23 (×2): 6.25 mg via INTRAVENOUS

## 2016-10-23 MED ORDER — FENTANYL CITRATE (PF) 100 MCG/2ML IJ SOLN
INTRAMUSCULAR | Status: DC | PRN
  Administered 2016-10-23 (×2): 50 ug via INTRAVENOUS
  Administered 2016-10-23: 100 ug via INTRAVENOUS

## 2016-10-23 MED ORDER — FENTANYL CITRATE (PF) 250 MCG/5ML IJ SOLN
INTRAMUSCULAR | Status: AC
  Filled 2016-10-23: qty 5

## 2016-10-23 MED ORDER — MIDAZOLAM HCL 2 MG/2ML IJ SOLN
INTRAMUSCULAR | Status: DC | PRN
  Administered 2016-10-23: 2 mg via INTRAVENOUS

## 2016-10-23 MED ORDER — MEPERIDINE HCL 25 MG/ML IJ SOLN: 6.2500 mg | INTRAMUSCULAR | Status: DC | PRN

## 2016-10-23 MED ORDER — SODIUM CHLORIDE 0.9 % IR SOLN
Status: DC | PRN
  Administered 2016-10-23: 3000 mL

## 2016-10-23 MED ORDER — LIDOCAINE HCL 1 % IJ SOLN
INTRAMUSCULAR | Status: AC
  Filled 2016-10-23: qty 20

## 2016-10-23 MED ORDER — OXYCODONE HCL 5 MG PO TABS: 5.0000 mg | ORAL_TABLET | Freq: Once | ORAL | Status: DC | PRN

## 2016-10-23 MED ORDER — METOCLOPRAMIDE HCL 5 MG/ML IJ SOLN
INTRAMUSCULAR | Status: AC
  Administered 2016-10-23: 10 mg via INTRAVENOUS
  Filled 2016-10-23: qty 2

## 2016-10-23 SURGICAL SUPPLY — 20 items
CANISTER SUCT 3000ML PPV (MISCELLANEOUS) ×8 IMPLANT
CATH ROBINSON RED A/P 16FR (CATHETERS) ×2 IMPLANT
CLOTH BEACON ORANGE TIMEOUT ST (SAFETY) ×2 IMPLANT
CONTAINER PREFILL 10% NBF 60ML (FORM) ×4 IMPLANT
DEVICE MYOSURE LITE (MISCELLANEOUS) IMPLANT
DEVICE MYOSURE REACH (MISCELLANEOUS) IMPLANT
DILATOR CANAL MILEX (MISCELLANEOUS) IMPLANT
FILTER ARTHROSCOPY CONVERTOR (FILTER) ×4 IMPLANT
GLOVE BIOGEL PI IND STRL 7.0 (GLOVE) ×1 IMPLANT
GLOVE BIOGEL PI INDICATOR 7.0 (GLOVE) ×1
GLOVE ECLIPSE 7.0 STRL STRAW (GLOVE) ×2 IMPLANT
GOWN STRL REUS W/TWL LRG LVL3 (GOWN DISPOSABLE) ×4 IMPLANT
MYOSURE XL FIBROID REM (MISCELLANEOUS) ×2
PACK VAGINAL MINOR WOMEN LF (CUSTOM PROCEDURE TRAY) ×2 IMPLANT
PAD OB MATERNITY 4.3X12.25 (PERSONAL CARE ITEMS) ×2 IMPLANT
SEAL ROD LENS SCOPE MYOSURE (ABLATOR) ×2 IMPLANT
SYSTEM TISS REMOVAL MYSR XL RM (MISCELLANEOUS) ×1 IMPLANT
TOWEL OR 17X24 6PK STRL BLUE (TOWEL DISPOSABLE) ×4 IMPLANT
TUBING AQUILEX INFLOW (TUBING) ×2 IMPLANT
TUBING AQUILEX OUTFLOW (TUBING) ×2 IMPLANT

## 2016-10-23 NOTE — Anesthesia Preprocedure Evaluation (Signed)
Anesthesia Evaluation  Patient identified by MRN, date of birth, ID band Patient awake    Reviewed: Allergy & Precautions, H&P , NPO status , Patient's Chart, lab work & pertinent test results, reviewed documented beta blocker date and time   History of Anesthesia Complications (+) PONV and history of anesthetic complications  Airway Mallampati: II  TM Distance: >3 FB Neck ROM: full    Dental   Pulmonary neg pulmonary ROS,    breath sounds clear to auscultation       Cardiovascular negative cardio ROS   Rhythm:regular     Neuro/Psych  Headaches, negative psych ROS   GI/Hepatic negative GI ROS, Neg liver ROS,   Endo/Other  negative endocrine ROS  Renal/GU negative Renal ROS  negative genitourinary   Musculoskeletal   Abdominal   Peds  Hematology negative hematology ROS (+) anemia ,   Anesthesia Other Findings See surgeon's H&P   Reproductive/Obstetrics negative OB ROS                             Anesthesia Physical  Anesthesia Plan  ASA: II  Anesthesia Plan: General   Post-op Pain Management:    Induction: Intravenous  PONV Risk Score and Plan: 4 or greater and Ondansetron, Dexamethasone, Propofol, Midazolam and Scopolamine patch - Pre-op  Airway Management Planned: LMA  Additional Equipment:   Intra-op Plan:   Post-operative Plan: Extubation in OR  Informed Consent: I have reviewed the patients History and Physical, chart, labs and discussed the procedure including the risks, benefits and alternatives for the proposed anesthesia with the patient or authorized representative who has indicated his/her understanding and acceptance.   Dental Advisory Given  Plan Discussed with: CRNA and Surgeon  Anesthesia Plan Comments:         Anesthesia Quick Evaluation

## 2016-10-23 NOTE — Transfer of Care (Signed)
Immediate Anesthesia Transfer of Care Note  Patient: Teresa [name]  Procedure(s) Performed: Procedure(s): DILATATION & CURETTAGE/HYSTEROSCOPY WITH MYOSURE (N/A)  Patient Location: PACU  Anesthesia Type:General  Level of Consciousness: sedated  Airway & Oxygen Therapy: Patient Spontanous Breathing  Post-op Assessment: Post -op Vital signs reviewed and stable  Post vital signs: Reviewed and stable  Last Vitals:  Vitals:   10/23/16 1134  BP: 117/77  Pulse: 75  Resp: 18  Temp: 36.5 C    Last Pain:  Vitals:   10/23/16 1134  TempSrc: Oral      Patients Stated Pain Goal: 2 (89/78/47 8412)  Complications: No apparent anesthesia complications

## 2016-10-23 NOTE — Anesthesia Procedure Notes (Signed)
Procedure Name: LMA Insertion Date/Time: 10/23/2016 12:19 PM Performed by: Casimer Lanius A Pre-anesthesia Checklist: Patient being monitored, Patient identified, Emergency Drugs available and Suction available Patient Re-evaluated:Patient Re-evaluated prior to inductionOxygen Delivery Method: Circle system utilized Preoxygenation: Pre-oxygenation with 100% oxygen Intubation Type: IV induction and Inhalational induction Ventilation: Mask ventilation without difficulty LMA: LMA inserted LMA Size: 4.0 Number of attempts: 1 Dental Injury: Teeth and Oropharynx as per pre-operative assessment

## 2016-10-23 NOTE — Anesthesia Postprocedure Evaluation (Signed)
Anesthesia Post Note  Patient: Teresa [name]  Procedure(s) Performed: Procedure(s) (LRB): DILATATION & CURETTAGE/HYSTEROSCOPY WITH MYOSURE (N/A)     Patient location during evaluation: PACU Anesthesia Type: General Level of consciousness: awake and alert Pain management: pain level controlled Vital Signs Assessment: post-procedure vital signs reviewed and stable Respiratory status: spontaneous breathing, nonlabored ventilation and respiratory function stable Cardiovascular status: blood pressure returned to baseline and stable Postop Assessment: no signs of nausea or vomiting Anesthetic complications: no    Last Vitals:  Vitals:   10/23/16 1345 10/23/16 1400  BP: 132/90 128/82  Pulse: 64 64  Resp: 10 (!) 9  Temp:      Last Pain:  Vitals:   10/23/16 1345  TempSrc:   PainSc: Asleep   Pain Goal: Patients Stated Pain Goal: 2 (10/23/16 1134)               Lynda Rainwater

## 2016-10-23 NOTE — H&P (Signed)
Teresa [name] is an 41 y.o. G32P1001 black female  Who presents to the OR for a hysteroscopic resection of a 3 cm  Submucous fibroid causing severe anemia. She recently had a blood transfusion of 4 units when her hgb decreased to 4. She still desires fertilty.   Chief Complaint: HPI:  Past Medical History:  Diagnosis Date  . Breast discharge   . History of recent blood transfusion 10/10/2016   x4 units RBCs  . Menorrhagia   . Migraines   . Severe anemia    DUE TO UTERINE BLEEDING FROM FIBROID-- s/p  4 units blood transfusion 10-10-2016 and hx IV iron  . Submucous uterine fibroid     Past Surgical History:  Procedure Laterality Date  . AXILLARY HIDRADENITIS EXCISION Left 02/12/2011  . CESAREAN SECTION  12/31/2004  . HYDRADENITIS EXCISION  01/07/2012   Procedure: EXCISION HYDRADENITIS AXILLA;  Surgeon: Cristine Polio, MD;  Location: [facility];  Service: Plastics;  Laterality: N/A;  excision hydrradenitis right axilla with ryan pollock closure  . LAPAROSCOPY  03/15/2000   destruction of hydatid of Morgagni    Family History  Problem Relation Age of Onset  . Breast cancer Maternal Aunt   . Breast cancer Maternal Aunt    Social History:  reports that she has never smoked. She has never used smokeless tobacco. She reports that she does not drink alcohol or use drugs.  Allergies: No Known Allergies  Medications Prior to Admission  Medication Sig Dispense Refill  . Ibuprofen-Diphenhydramine HCl (IBUPROFEN PM) 200-25 MG CAPS Take 3-4 tablets by mouth at bedtime as needed (for pain/sleep).    . tranexamic acid (LYSTEDA) 650 MG TABS tablet Take 1,300 mg by mouth 3 (three) times daily. FOR 5 DAYS DURING MENSTRUAL CYCLE  0       Blood pressure 117/77, pulse 75, temperature 97.7 F (36.5 C), temperature source Oral, resp. rate 18, height 5\' 5"  (1.651 m), weight 148 lb (67.1 kg), last menstrual period 10/16/2016, SpO2 100 %. General appearance: alert and appears stated age   CV- rrr without murmer Lungs cta abd- wnl   Lab Results  Component Value Date   WBC 6.4 11/21/2011   HGB 11.7 (L) 01/07/2012   HCT 27.5 (L) 11/21/2011   MCV 58.9 (L) 11/21/2011   PLT 217 11/21/2011    Patient Active Problem List   Diagnosis Date Noted  . Anemia 11/21/2011   IMP/ Symptomatic uterine fibroid         Anemia Plan/ resection of fibrod with myosure.  ANDERSON,MARK E 10/23/2016, 11:56 AM

## 2016-10-24 ENCOUNTER — Encounter (HOSPITAL_COMMUNITY): Payer: Self-pay | Admitting: Obstetrics and Gynecology

## 2016-10-24 NOTE — Op Note (Signed)
NAMEATHALIAH, BAUMBACH NO.:  [id number]  MEDICAL RECORD NO.:  [id number]  LOCATION:                                 FACILITY:  PHYSICIAN:  Freda Munro, M.D.         DATE OF BIRTH:  DATE OF PROCEDURE:  10/23/2016 DATE OF DISCHARGE:                              OPERATIVE REPORT   PREOPERATIVE DIAGNOSIS: 1. Large submucous uterine fibroid. 2. Severe anemia.  POSTOPERATIVE DIAGNOSIS: 1. Large submucous uterine fibroid. 2. Severe anemia.  PROCEDURE: 1. Hysteroscopy. 2. Resection of submucous fibroid with MyoSure device.  SURGEON:  Freda Munro, M.D.  ANESTHESIA:  General and local.  ANTIBIOTICS:  Ancef 2 g.  DRAINS:  Red rubber catheter to bladder.  SPECIMENS:  Portions of submucous fibroid sent to pathology.  COMPLICATIONS:  None.  ESTIMATED BLOOD LOSS:  Minimal.  PROCEDURE IN DETAIL:  The patient was taken to the operating room where she was placed in dorsal supine position.  A general anesthetic was administered without difficulty.  She was then placed in dorsal lithotomy position.  She was prepped and draped in the usual fashion for this procedure.  Sterile speculum was placed in the vagina.  20 mL of 1% lidocaine was used for paracervical block.  A single-tooth tenaculum was applied to the anterior cervical lip.  The cervix was then serially dilated to a 25-French.  The hysteroscope was advanced through the endocervical canal which appeared to be normal.  On entering the uterine cavity, the patient was noted to have a large submucous fibroid taking up most of the uterine cavity.  The base was on the posterior aspect. The large MyoSure device was then setup and placed into the uterine cavity.  The fibroid was removed in its entirety.  The base was also treated with MyoSure making the area of implant the area of origination even with the rest of the myometrium.  At this point, the procedure was concluded.  The patient was taken to the  recovery room in stable condition.  Instrument and lap count was correct x2.  She will be discharged to home.  She will be instructed to take Advil as needed.  She will follow up in the office in 4 weeks.    ______________________________ Freda Munro, M.D.   ______________________________ Freda Munro, M.D.    MA/MEDQ  D:  10/23/2016  T:  10/23/2016  Job:  [id number]

## 2016-11-13 ENCOUNTER — Encounter (HOSPITAL_COMMUNITY): Payer: Self-pay | Admitting: Obstetrics and Gynecology

## 2019-05-15 DIAGNOSIS — U071 COVID-19: Secondary | ICD-10-CM

## 2019-05-15 HISTORY — DX: COVID-19: U07.1

## 2021-07-11 ENCOUNTER — Ambulatory Visit: Payer: BC Managed Care – PPO | Admitting: Internal Medicine

## 2021-07-11 ENCOUNTER — Other Ambulatory Visit: Payer: Self-pay

## 2021-07-11 ENCOUNTER — Encounter: Payer: Self-pay | Admitting: Oncology

## 2021-07-12 ENCOUNTER — Ambulatory Visit: Payer: BC Managed Care – PPO | Attending: Internal Medicine | Admitting: Nurse Practitioner

## 2021-07-12 ENCOUNTER — Encounter: Payer: Self-pay | Admitting: Nurse Practitioner

## 2021-07-12 VITALS — BP 127/84 | HR 84 | Resp 18 | Ht 65.0 in | Wt 166.5 lb

## 2021-07-12 DIAGNOSIS — R928 Other abnormal and inconclusive findings on diagnostic imaging of breast: Secondary | ICD-10-CM

## 2021-07-12 DIAGNOSIS — Z862 Personal history of diseases of the blood and blood-forming organs and certain disorders involving the immune mechanism: Secondary | ICD-10-CM | POA: Diagnosis not present

## 2021-07-12 DIAGNOSIS — Z1231 Encounter for screening mammogram for malignant neoplasm of breast: Secondary | ICD-10-CM | POA: Diagnosis not present

## 2021-07-12 DIAGNOSIS — Z114 Encounter for screening for human immunodeficiency virus [HIV]: Secondary | ICD-10-CM | POA: Diagnosis not present

## 2021-07-12 DIAGNOSIS — N921 Excessive and frequent menstruation with irregular cycle: Secondary | ICD-10-CM

## 2021-07-12 DIAGNOSIS — Z1211 Encounter for screening for malignant neoplasm of colon: Secondary | ICD-10-CM | POA: Diagnosis not present

## 2021-07-12 DIAGNOSIS — Z7689 Persons encountering health services in other specified circumstances: Secondary | ICD-10-CM | POA: Diagnosis not present

## 2021-07-12 DIAGNOSIS — Z1159 Encounter for screening for other viral diseases: Secondary | ICD-10-CM

## 2021-07-12 NOTE — Progress Notes (Signed)
? ?Assessment & Plan:  ?[name] was seen today for establish care. ? ?Diagnoses and all orders for this visit: ? ?Encounter to establish care ? ?Colon cancer screening ?-     Ambulatory referral to Gastroenterology ? ?Breast cancer screening by mammogram ?-     MS DIGITAL SCREENING TOMO BILATERAL; Future ?-     MM Digital Diagnostic Bilat; Future ?-     US BREAST LTD UNI RIGHT INC AXILLA; Future ? ?Encounter for screening for HIV ?-     HIV antibody (with reflex) ? ?Need for hepatitis C screening test ?-     HCV Ab w Reflex to Quant PCR ?-     Interpretation: ? ?History of anemia ?-     CMP14+EGFR ?-     CBC ? ?Menorrhagia with irregular cycle ?-     US PELVIC COMPLETE WITH TRANSVAGINAL; Future ? ?Abnormal mammogram of right breast ?-     MM Digital Diagnostic Bilat; Future ?-     US BREAST LTD UNI RIGHT INC AXILLA; Future ? ? ? ?Patient has been counseled on age-appropriate routine health concerns for screening and prevention. These are reviewed and up-to-date. Referrals have been placed accordingly. Immunizations are up-to-date or declined.    ?Subjective:  ? ?Chief Complaint  ?Patient presents with  ? Establish Care  ? ?HPI ?Teresa [name] 46 y.o. female presents to office today to establish care.  ?She has a past medical history of Breast discharge, History of recent blood transfusion (10/10/2016), Menorrhagia, Migraines, Severe anemia, and Submucous uterine fibroid.  ? ?Currently endorses palpitations and fatigue. She has a history of anemia requiring blood transfusion. Will check CBC for anemia today. She is currently not taking any iron supplements. Notes menorrhagia and metrorrhagia.  ? ?Review of Systems  ?Constitutional:  Positive for malaise/fatigue. Negative for fever and weight loss.  ?HENT: Negative.  Negative for nosebleeds.   ?Eyes: Negative.  Negative for blurred vision, double vision and photophobia.  ?Respiratory: Negative.  Negative for cough and shortness of breath.   ?Cardiovascular:  Positive  for palpitations. Negative for chest pain and leg swelling.  ?Gastrointestinal: Negative.  Negative for heartburn, nausea and vomiting.  ?Musculoskeletal: Negative.  Negative for myalgias.  ?Neurological: Negative.  Negative for dizziness, focal weakness, seizures and headaches.  ?Psychiatric/Behavioral: Negative.  Negative for suicidal ideas.   ? ?Past Medical History:  ?Diagnosis Date  ? Breast discharge   ? History of recent blood transfusion 10/10/2016  ? x4 units RBCs  ? Menorrhagia   ? Migraines   ? Severe anemia   ? DUE TO UTERINE BLEEDING FROM FIBROID-- s/p  4 units blood transfusion 10-10-2016 and hx IV iron  ? Submucous uterine fibroid   ? ? ?Past Surgical History:  ?Procedure Laterality Date  ? AXILLARY HIDRADENITIS EXCISION Left 02/12/2011  ? CESAREAN SECTION  12/31/2004  ? DILATATION & CURETTAGE/HYSTEROSCOPY WITH MYOSURE N/A 10/23/2016  ? Procedure: [facility];  Surgeon: Olga Millers, MD;  Location: [facility];  Service: Gynecology;  Laterality: N/A;  ? HYDRADENITIS EXCISION  01/07/2012  ? Procedure: EXCISION HYDRADENITIS AXILLA;  Surgeon: Cristine Polio, MD;  Location: [facility];  Service: Plastics;  Laterality: N/A;  excision hydrradenitis right axilla with ryan pollock closure  ? LAPAROSCOPY  03/15/2000  ? destruction of hydatid of Morgagni  ? ? ?Family History  ?Problem Relation Age of Onset  ? Breast cancer Maternal Aunt   ? Breast cancer Maternal Aunt   ? ? ?Social  History Reviewed with no changes to be made today.  ? ?Outpatient Medications Prior to Visit  ?Medication Sig Dispense Refill  ? tranexamic acid (LYSTEDA) 650 MG TABS tablet Take 1,300 mg by mouth 3 (three) times daily. FOR 5 DAYS DURING MENSTRUAL CYCLE (Patient not taking: Reported on 07/12/2021)  0  ? ?No facility-administered medications prior to visit.  ? ? ?Allergies  ?Allergen Reactions  ? Goodys Extra Strength [Aspirin-Acetaminophen-Caffeine] Nausea And Vomiting  ? ? ?    ?Objective:  ?  ?BP 127/84   Pulse 84   Resp 18   Ht 5' 5"  (1.651 m)   Wt 166 lb 8 oz (75.5 kg)   LMP 06/20/2021 (Exact Date)   SpO2 99%   BMI 27.71 kg/m?  ?Wt Readings from Last 3 Encounters:  ?07/12/21 166 lb 8 oz (75.5 kg)  ?10/16/16 148 lb (67.1 kg)  ?10/10/16 148 lb (67.1 kg)  ? ? ?Physical Exam ?Vitals and nursing note reviewed.  ?Constitutional:   ?   Appearance: She is well-developed.  ?HENT:  ?   Head: Normocephalic and atraumatic.  ?Cardiovascular:  ?   Rate and Rhythm: Normal rate and regular rhythm.  ?   Heart sounds: Normal heart sounds. No murmur heard. ?  No friction rub. No gallop.  ?Pulmonary:  ?   Effort: Pulmonary effort is normal. No tachypnea or respiratory distress.  ?   Breath sounds: Normal breath sounds. No decreased breath sounds, wheezing, rhonchi or rales.  ?Chest:  ?   Chest wall: No tenderness.  ?Abdominal:  ?   General: Bowel sounds are normal.  ?   Palpations: Abdomen is soft.  ?Musculoskeletal:     ?   General: Normal range of motion.  ?   Cervical back: Normal range of motion.  ?Skin: ?   General: Skin is warm and dry.  ?Neurological:  ?   Mental Status: She is alert and oriented to person, place, and time.  ?   Coordination: Coordination normal.  ?Psychiatric:     ?   Behavior: Behavior normal. Behavior is cooperative.     ?   Thought Content: Thought content normal.     ?   Judgment: Judgment normal.  ? ? ? ? ?   ?Patient has been counseled extensively about nutrition and exercise as well as the importance of adherence with medications and regular follow-up. The patient was given clear instructions to go to ER or return to medical center if symptoms don't improve, worsen or new problems develop. The patient verbalized understanding.  ? ?Follow-up: Return for PAP SMEAR.  ? ?Gildardo Pounds, FNP-BC ?[facility] ?[place], [place] ?[phone]   ?07/16/2021, 7:57 PM ?

## 2021-07-13 LAB — CBC
Hematocrit: 26.9 % — ABNORMAL LOW (ref 34.0–46.6)
Hemoglobin: 6.8 g/dL — CL (ref 11.1–15.9)
MCH: 14.3 pg — ABNORMAL LOW (ref 26.6–33.0)
MCHC: 25.3 g/dL — ABNORMAL LOW (ref 31.5–35.7)
MCV: 56 fL — ABNORMAL LOW (ref 79–97)
Platelets: 337 10*3/uL (ref 150–450)
RBC: 4.77 x10E6/uL (ref 3.77–5.28)
RDW: 19.8 % — ABNORMAL HIGH (ref 11.7–15.4)
WBC: 3.7 10*3/uL (ref 3.4–10.8)

## 2021-07-13 LAB — CMP14+EGFR
ALT: 16 IU/L (ref 0–32)
AST: 16 IU/L (ref 0–40)
Albumin/Globulin Ratio: 1.7 (ref 1.2–2.2)
Albumin: 4.6 g/dL (ref 3.8–4.8)
Alkaline Phosphatase: 79 IU/L (ref 44–121)
BUN/Creatinine Ratio: 10 (ref 9–23)
BUN: 8 mg/dL (ref 6–24)
Bilirubin Total: 0.3 mg/dL (ref 0.0–1.2)
CO2: 22 mmol/L (ref 20–29)
Calcium: 9.6 mg/dL (ref 8.7–10.2)
Chloride: 106 mmol/L (ref 96–106)
Creatinine, Ser: 0.78 mg/dL (ref 0.57–1.00)
Globulin, Total: 2.7 g/dL (ref 1.5–4.5)
Glucose: 84 mg/dL (ref 70–99)
Potassium: 4 mmol/L (ref 3.5–5.2)
Sodium: 140 mmol/L (ref 134–144)
Total Protein: 7.3 g/dL (ref 6.0–8.5)
eGFR: 94 mL/min/{1.73_m2} (ref >59)

## 2021-07-13 LAB — HCV INTERPRETATION

## 2021-07-13 LAB — HIV ANTIBODY (ROUTINE TESTING W REFLEX): HIV Screen 4th Generation wRfx: NONREACTIVE

## 2021-07-13 LAB — HCV AB W REFLEX TO QUANT PCR: HCV Ab: NONREACTIVE

## 2021-07-16 ENCOUNTER — Encounter: Payer: Self-pay | Admitting: Nurse Practitioner

## 2021-07-17 ENCOUNTER — Telehealth: Payer: Self-pay

## 2021-07-17 NOTE — Telephone Encounter (Signed)
Said she would go on the 21 to get the blood transfusion. Stated she just started her cycle and did not know about getting blood during her cycle.  ?

## 2021-07-20 ENCOUNTER — Ambulatory Visit (HOSPITAL_COMMUNITY): Payer: BC Managed Care – PPO

## 2021-07-21 ENCOUNTER — Ambulatory Visit
Admission: RE | Admit: 2021-07-21 | Discharge: 2021-07-21 | Disposition: A | Discharge status: Home / Self Care | Payer: BC Managed Care – PPO | Source: Ambulatory Visit | Attending: Nurse Practitioner | Admitting: Nurse Practitioner

## 2021-07-21 DIAGNOSIS — N921 Excessive and frequent menstruation with irregular cycle: Secondary | ICD-10-CM

## 2021-07-25 ENCOUNTER — Ambulatory Visit
Admission: RE | Admit: 2021-07-25 | Discharge: 2021-07-25 | Disposition: A | Discharge status: Home / Self Care | Payer: BC Managed Care – PPO | Source: Ambulatory Visit | Attending: Nurse Practitioner | Admitting: Nurse Practitioner

## 2021-07-25 ENCOUNTER — Other Ambulatory Visit: Payer: Self-pay | Admitting: Nurse Practitioner

## 2021-07-25 DIAGNOSIS — N858 Other specified noninflammatory disorders of uterus: Secondary | ICD-10-CM | POA: Diagnosis not present

## 2021-07-25 DIAGNOSIS — N920 Excessive and frequent menstruation with regular cycle: Secondary | ICD-10-CM | POA: Diagnosis not present

## 2021-07-25 DIAGNOSIS — N83201 Unspecified ovarian cyst, right side: Secondary | ICD-10-CM | POA: Diagnosis not present

## 2021-07-25 DIAGNOSIS — R9389 Abnormal findings on diagnostic imaging of other specified body structures: Secondary | ICD-10-CM

## 2021-07-25 DIAGNOSIS — N921 Excessive and frequent menstruation with irregular cycle: Secondary | ICD-10-CM

## 2021-07-27 ENCOUNTER — Telehealth: Payer: Self-pay

## 2021-07-27 NOTE — Telephone Encounter (Signed)
Called patient reviewed all information and repeated back to me. Will call if any questions.  ? ?

## 2021-07-31 ENCOUNTER — Other Ambulatory Visit: Payer: Self-pay | Admitting: Nurse Practitioner

## 2021-07-31 DIAGNOSIS — D500 Iron deficiency anemia secondary to blood loss (chronic): Secondary | ICD-10-CM

## 2021-07-31 MED ORDER — FERUMOXYTOL INJECTION 510 MG/17 ML: INTRAVENOUS | 0 refills | Status: DC

## 2021-08-01 ENCOUNTER — Non-Acute Institutional Stay (HOSPITAL_COMMUNITY)
Admission: RE | Admit: 2021-08-01 | Discharge: 2021-08-01 | Disposition: A | Discharge status: Home / Self Care | Payer: BC Managed Care – PPO | Source: Ambulatory Visit | Attending: Internal Medicine | Admitting: Internal Medicine

## 2021-08-01 ENCOUNTER — Other Ambulatory Visit: Payer: Self-pay

## 2021-08-01 DIAGNOSIS — D509 Iron deficiency anemia, unspecified: Secondary | ICD-10-CM | POA: Insufficient documentation

## 2021-08-01 MED ORDER — SODIUM CHLORIDE 0.9 % IV SOLN
510.0000 mg | Freq: Once | INTRAVENOUS | Status: AC
  Administered 2021-08-01: 510 mg via INTRAVENOUS
  Filled 2021-08-01: qty 17

## 2021-08-01 MED ORDER — SODIUM CHLORIDE 0.9 % IV SOLN: INTRAVENOUS | Status: DC | PRN

## 2021-08-01 NOTE — Progress Notes (Signed)
PATIENT CARE CENTER NOTE ? ? 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 ? ?Provider: Geryl Rankins, NP ? ? ?Procedure: Feraheme infusion  ? ? ?Note:  Patient received Feraheme infusion (dose 1 of 1) via PIV. Tolerated well with no adverse reaction. Observed patient for 30 minutes post infusion. Vital signs stable. Discharge instructions given. Patient alert, oriented and ambulatory at discharge.   ?

## 2021-08-14 ENCOUNTER — Ambulatory Visit
Admission: RE | Admit: 2021-08-14 | Discharge: 2021-08-14 | Disposition: A | Discharge status: Home / Self Care | Payer: BC Managed Care – PPO | Source: Ambulatory Visit | Attending: Nurse Practitioner | Admitting: Nurse Practitioner

## 2021-08-14 DIAGNOSIS — Z1231 Encounter for screening mammogram for malignant neoplasm of breast: Secondary | ICD-10-CM

## 2021-09-18 ENCOUNTER — Encounter: Payer: Self-pay | Admitting: Nurse Practitioner

## 2021-09-18 ENCOUNTER — Ambulatory Visit: Payer: BC Managed Care – PPO | Attending: Nurse Practitioner | Admitting: Nurse Practitioner

## 2021-09-18 VITALS — BP 128/87 | HR 88 | Temp 98.4°F | Resp 12 | Ht 65.0 in | Wt 175.5 lb

## 2021-09-18 DIAGNOSIS — Z1211 Encounter for screening for malignant neoplasm of colon: Secondary | ICD-10-CM | POA: Diagnosis not present

## 2021-09-18 DIAGNOSIS — G43709 Chronic migraine without aura, not intractable, without status migrainosus: Secondary | ICD-10-CM | POA: Diagnosis not present

## 2021-09-18 DIAGNOSIS — D500 Iron deficiency anemia secondary to blood loss (chronic): Secondary | ICD-10-CM

## 2021-09-18 DIAGNOSIS — Z114 Encounter for screening for human immunodeficiency virus [HIV]: Secondary | ICD-10-CM | POA: Diagnosis not present

## 2021-09-18 DIAGNOSIS — F5101 Primary insomnia: Secondary | ICD-10-CM

## 2021-09-18 MED ORDER — TRAZODONE HCL 100 MG PO TABS: 50.0000 mg | ORAL_TABLET | Freq: Every day | ORAL | 0 refills | Status: DC

## 2021-09-18 MED ORDER — SUMATRIPTAN SUCCINATE 50 MG PO TABS: 50.0000 mg | ORAL_TABLET | ORAL | 0 refills | Status: AC | PRN

## 2021-09-18 NOTE — Progress Notes (Signed)
? ?Assessment & Plan:  ?Ashaki was seen today for gynecologic exam. ? ?Diagnoses and all orders for this visit: ? ?Iron deficiency anemia due to chronic blood loss ?-     CBC with Differential ?-     Thyroid Panel With TSH ? ?Colon cancer screening ?-     Cologuard ? ?Encounter for screening for HIV ?-     HIV antibody (with reflex) ? ? ? ? ?Patient has been counseled on age-appropriate routine health concerns for screening and prevention. These are reviewed and up-to-date. Referrals have been placed accordingly. Immunizations are up-to-date or declined.    ?Subjective:  ? ?Chief Complaint  ?Patient presents with  ? Anemia  ? ?HPI ?Teresa [name] 46 y.o. female presents to office today initially for pap smear however she states she was not prepared for a pap smear today and would just like to defer it to GYN in June when she will be seen for hysterectomy. She has a history of chronic blood loss anemia due to menorrhagia. Last iron infusion was 2 months ago. Pelvic US was also abnormal. Lats pap smear was almost 10 years ago.  ?07-25-2021 ?3.2 cm diameter mass at upper central uterus, obscuring endometrial ?complex, question submucosal leiomyoma versus endometrial mass; in ?the setting of menorrhagia, consider further evaluation with ?sonohysterogram to differentiate endometrial versus myometrial mass ?prior to hysteroscopy. ? ?She endorses frequent migraines and insomnia.  Believes the migraines are a result of not sleeping well. She has tried over the counter medications (herbals), melatonin, sleepy tea time all of which were ineffective. She has trouble falling asleep and staying asleep at night.  Denies caffeine use or napping during the day.  ? ?Review of Systems  ?Constitutional:  Positive for malaise/fatigue. Negative for chills, fever and weight loss.  ?Respiratory: Negative.  Negative for cough, shortness of breath and wheezing.   ?Cardiovascular:  Positive for palpitations. Negative for chest pain, orthopnea  and leg swelling.  ?Gastrointestinal:  Negative for abdominal pain.  ?Genitourinary: Negative.  Negative for flank pain.  ?Skin: Negative.  Negative for rash.  ?Psychiatric/Behavioral:  Negative for suicidal ideas. The patient has insomnia.   ? ?Past Medical History:  ?Diagnosis Date  ? Breast discharge   ? History of recent blood transfusion 10/10/2016  ? x4 units RBCs  ? Menorrhagia   ? Migraines   ? Severe anemia   ? DUE TO UTERINE BLEEDING FROM FIBROID-- s/p  4 units blood transfusion 10-10-2016 and hx IV iron  ? Submucous uterine fibroid   ? ? ?Past Surgical History:  ?Procedure Laterality Date  ? AXILLARY HIDRADENITIS EXCISION Left 02/12/2011  ? CESAREAN SECTION  12/31/2004  ? DILATATION & CURETTAGE/HYSTEROSCOPY WITH MYOSURE N/A 10/23/2016  ? Procedure: [facility];  Surgeon: Olga Millers, MD;  Location: [facility];  Service: Gynecology;  Laterality: N/A;  ? HYDRADENITIS EXCISION  01/07/2012  ? Procedure: EXCISION HYDRADENITIS AXILLA;  Surgeon: Cristine Polio, MD;  Location: [facility];  Service: Plastics;  Laterality: N/A;  excision hydrradenitis right axilla with ryan pollock closure  ? LAPAROSCOPY  03/15/2000  ? destruction of hydatid of Morgagni  ? ? ?Family History  ?Problem Relation Age of Onset  ? Breast cancer Maternal Aunt   ? Breast cancer Maternal Aunt   ? ? ?Social History Reviewed with no changes to be made today.  ? ?Outpatient Medications Prior to Visit  ?Medication Sig Dispense Refill  ? ferumoxytol (FERAHEME) 510 MG/17ML SOLN injection ferumoxytol (FERAHEME) 510  mg in sodium chloride 0.9 % 100 mL IVPB Admin Instructions: Monitor patient carefully for allergic reactions during Feraheme administration and for at least 30 minutes following each infusion. Allow minimum of 30 minutes between Feraheme and admin of meds that could potentially cause hypersensitivity reactions, such as chemotherapeutic agents or monoclonal antibodies. Route: Intravenous  (Patient not taking: Reported on 09/18/2021) 17 mL 0  ? tranexamic acid (LYSTEDA) 650 MG TABS tablet Take 1,300 mg by mouth 3 (three) times daily. FOR 5 DAYS DURING MENSTRUAL CYCLE (Patient not taking: Reported on 07/12/2021)  0  ? ?No facility-administered medications prior to visit.  ? ? ?Allergies  ?Allergen Reactions  ? Goodys Extra Strength [Aspirin-Acetaminophen-Caffeine] Nausea And Vomiting  ? ? ?   ?Objective:  ?  ?BP 128/87   Pulse 88   Temp 98.4 ?F (36.9 ?C) (Oral)   Resp 12   Ht '5\' 5"'$  (1.651 m)   Wt 175 lb 8 oz (79.6 kg)   LMP 09/02/2021 (Exact Date)   SpO2 100%   BMI 29.20 kg/m?  ?Wt Readings from Last 3 Encounters:  ?09/18/21 175 lb 8 oz (79.6 kg)  ?07/12/21 166 lb 8 oz (75.5 kg)  ?10/16/16 148 lb (67.1 kg)  ? ? ?Physical Exam ?Vitals and nursing note reviewed.  ?Constitutional:   ?   Appearance: She is well-developed.  ?HENT:  ?   Head: Normocephalic and atraumatic.  ?Cardiovascular:  ?   Rate and Rhythm: Normal rate and regular rhythm.  ?   Heart sounds: Normal heart sounds. No murmur heard. ?  No friction rub. No gallop.  ?Pulmonary:  ?   Effort: Pulmonary effort is normal. No tachypnea or respiratory distress.  ?   Breath sounds: Normal breath sounds. No decreased breath sounds, wheezing, rhonchi or rales.  ?Chest:  ?   Chest wall: No tenderness.  ?Abdominal:  ?   General: Bowel sounds are normal.  ?   Palpations: Abdomen is soft.  ?Musculoskeletal:     ?   General: Normal range of motion.  ?   Cervical back: Normal range of motion.  ?Skin: ?   General: Skin is warm and dry.  ?Neurological:  ?   Mental Status: She is alert and oriented to person, place, and time.  ?   Coordination: Coordination normal.  ?Psychiatric:     ?   Behavior: Behavior normal. Behavior is cooperative.     ?   Thought Content: Thought content normal.     ?   Judgment: Judgment normal.  ? ? ? ? ?   ?Patient has been counseled extensively about nutrition and exercise as well as the importance of adherence with medications  and regular follow-up. The patient was given clear instructions to go to ER or return to medical center if symptoms don't improve, worsen or new problems develop. The patient verbalized understanding.  ? ?Follow-up: Return if symptoms worsen or fail to improve.  ? ?Gildardo Pounds, FNP-BC ?[place] ?[place], [place] ?[phone]   ?09/18/2021, 2:27 PM ?

## 2021-09-21 LAB — CBC WITH DIFFERENTIAL/PLATELET
Basophils Absolute: 0 10*3/uL (ref 0.0–0.2)
Basos: 1 %
EOS (ABSOLUTE): 0.1 10*3/uL (ref 0.0–0.4)
Eos: 2 %
Hematocrit: 31.9 % — ABNORMAL LOW (ref 34.0–46.6)
Hemoglobin: 9.1 g/dL — ABNORMAL LOW (ref 11.1–15.9)
Immature Grans (Abs): 0 10*3/uL (ref 0.0–0.1)
Immature Granulocytes: 0 %
Lymphocytes Absolute: 1.4 10*3/uL (ref 0.7–3.1)
Lymphs: 40 %
MCH: 19.7 pg — ABNORMAL LOW (ref 26.6–33.0)
MCHC: 28.5 g/dL — ABNORMAL LOW (ref 31.5–35.7)
MCV: 69 fL — ABNORMAL LOW (ref 79–97)
Monocytes Absolute: 0.3 10*3/uL (ref 0.1–0.9)
Monocytes: 9 %
Neutrophils Absolute: 1.7 10*3/uL (ref 1.4–7.0)
Neutrophils: 48 %
Platelets: 251 10*3/uL (ref 150–450)
RBC: 4.63 x10E6/uL (ref 3.77–5.28)
RDW: 27.6 % — ABNORMAL HIGH (ref 11.7–15.4)
WBC: 3.5 10*3/uL (ref 3.4–10.8)

## 2021-09-21 LAB — IRON,TIBC AND FERRITIN PANEL
Ferritin: 7 ng/mL — ABNORMAL LOW (ref 15–150)
Iron Saturation: 5 % — CL (ref 15–55)
Iron: 21 ug/dL — ABNORMAL LOW (ref 27–159)
Total Iron Binding Capacity: 413 ug/dL (ref 250–450)
UIBC: 392 ug/dL (ref 131–425)

## 2021-09-21 LAB — THYROID PANEL WITH TSH
Free Thyroxine Index: 1.8 (ref 1.2–4.9)
T3 Uptake Ratio: 27 % (ref 24–39)
T4, Total: 6.5 ug/dL (ref 4.5–12.0)
TSH: 2.1 u[IU]/mL (ref 0.450–4.500)

## 2021-09-21 LAB — HIV ANTIBODY (ROUTINE TESTING W REFLEX): HIV Screen 4th Generation wRfx: NONREACTIVE

## 2021-10-23 ENCOUNTER — Other Ambulatory Visit (HOSPITAL_COMMUNITY)
Admission: RE | Admit: 2021-10-23 | Discharge: 2021-10-23 | Disposition: A | Discharge status: Home / Self Care | Payer: BC Managed Care – PPO | Source: Ambulatory Visit | Attending: Obstetrics and Gynecology | Admitting: Obstetrics and Gynecology

## 2021-10-23 ENCOUNTER — Encounter: Payer: Self-pay | Admitting: Obstetrics and Gynecology

## 2021-10-23 ENCOUNTER — Ambulatory Visit: Payer: BC Managed Care – PPO | Admitting: Obstetrics and Gynecology

## 2021-10-23 VITALS — BP 123/86 | HR 94 | Ht 65.0 in | Wt 172.2 lb

## 2021-10-23 DIAGNOSIS — N939 Abnormal uterine and vaginal bleeding, unspecified: Secondary | ICD-10-CM | POA: Insufficient documentation

## 2021-10-23 DIAGNOSIS — D250 Submucous leiomyoma of uterus: Secondary | ICD-10-CM

## 2021-10-23 DIAGNOSIS — N946 Dysmenorrhea, unspecified: Secondary | ICD-10-CM | POA: Diagnosis not present

## 2021-10-23 DIAGNOSIS — N83201 Unspecified ovarian cyst, right side: Secondary | ICD-10-CM

## 2021-10-23 DIAGNOSIS — Z124 Encounter for screening for malignant neoplasm of cervix: Secondary | ICD-10-CM | POA: Insufficient documentation

## 2021-10-23 DIAGNOSIS — D649 Anemia, unspecified: Secondary | ICD-10-CM

## 2021-10-23 DIAGNOSIS — B3731 Acute candidiasis of vulva and vagina: Secondary | ICD-10-CM

## 2021-10-23 DIAGNOSIS — N760 Acute vaginitis: Secondary | ICD-10-CM | POA: Diagnosis not present

## 2021-10-23 LAB — POCT PREGNANCY, URINE: Preg Test, Ur: NEGATIVE

## 2021-10-23 MED ORDER — TRANEXAMIC ACID 650 MG PO TABS: 1300.0000 mg | ORAL_TABLET | Freq: Three times a day (TID) | ORAL | 2 refills | Status: DC

## 2021-10-23 MED ORDER — FLUCONAZOLE 150 MG PO TABS: 150.0000 mg | ORAL_TABLET | ORAL | 0 refills | Status: AC

## 2021-10-23 NOTE — Progress Notes (Signed)
Obstetrics and Gynecology New Patient Evaluation  Appointment Date: 10/23/2021  OBGYN Clinic: [facility]  Primary Care Provider: Gildardo Pounds  Referring Provider: Gildardo Pounds, NP  Chief Complaint:  Chief Complaint  Patient presents with   Follow-up   Consult    For hysterectomy     History of Present Illness: Teresa [name] is a 46 y.o. African-American G1P1001 (Patient's last menstrual period was 09/27/2021 (exact date).), seen for the above chief complaint. Her past medical history is significant for h/o c/s x 1, BMI 29, h/o 2018 myosure myomectomy for submucosal fibroid, anemia, migraines.   Patient with long history of heavy bleeding, not really painful. She states the 2018 myosure helped for a few months but then her bleeding became heavy; op note states the submucsoal fibroid took up most of the cavity.  Her periods are qmonth but can be prolonged and can last up to three weeks. Most recent period was on 5/17 and to the 29; she feels like she is about to start her period soon, no current bleeding or spotting.   Patient seen by PCP and had u/s that shows 3-3.5cm ?submucosal fibroid vs endometrial mass with benign 2.4cm RO cyst (see below)  She denies any menopausal s/s.   Review of Systems: Pertinent items noted in HPI and remainder of comprehensive ROS otherwise negative.   Patient Active Problem List   Diagnosis Date Noted   Fibroids, submucosal 10/24/2021   Right ovarian cyst 10/24/2021   Abnormal uterine bleeding (AUB) 10/23/2021   Anemia 11/21/2011   Past Medical History:  Past Medical History:  Diagnosis Date   Breast discharge    History of recent blood transfusion 10/10/2016   x4 units RBCs   Menorrhagia    Migraines    Severe anemia    DUE TO UTERINE BLEEDING FROM FIBROID-- s/p  4 units blood transfusion 10-10-2016 and hx IV iron   Submucous uterine fibroid     Past Surgical History:  Past Surgical History:   Procedure Laterality Date   AXILLARY HIDRADENITIS EXCISION Left 02/12/2011   CESAREAN SECTION  12/31/2004   DILATATION & CURETTAGE/HYSTEROSCOPY WITH MYOSURE N/A 10/23/2016   Procedure: DILATATION & CURETTAGE/HYSTEROSCOPY WITH MYOSURE;  Surgeon: Olga Millers, MD;  Location: [facility];  Service: Gynecology;  Laterality: N/A;   HYDRADENITIS EXCISION  01/07/2012   Procedure: EXCISION HYDRADENITIS AXILLA;  Surgeon: Cristine Polio, MD;  Location: [place];  Service: Plastics;  Laterality: N/A;  excision hydrradenitis right axilla with ryan pollock closure   LAPAROSCOPY  03/15/2000   destruction of hydatid of [place]    Past Obstetrical History:  OB History  Gravida Para Term Preterm AB Living  '1 1 1 '$ 0 0 1  SAB IAB Ectopic Multiple Live Births  0 0 0 0 1    # Outcome Date GA Lbr Len/2nd Weight Sex Delivery Anes PTL Lv  1 Term 12/31/04    F CS-LTranv   LIV   Past Gynecological History: As per HPI. History of Pap Smear(s): Yes.   Last pap unknown, which was negative She is currently using no method for contraception.   Social History:  Social History   Socioeconomic History   Marital status: Married    Spouse name: Not on file   Number of children: Not on file   Years of education: Not on file   Highest education level: Not on file  Occupational History   Not on file  Tobacco Use  Smoking status: Never   Smokeless tobacco: Never  Vaping Use   Vaping Use: Never used  Substance and Sexual Activity   Alcohol use: No   Drug use: Not Currently    Types: Marijuana    Comment: last use yesterday   Sexual activity: Not Currently    Birth control/protection: None  Other Topics Concern   Not on file  Social History Narrative   Not on file   Social Determinants of Health   Financial Resource Strain: Not on file  Food Insecurity: No Food Insecurity (10/23/2021)   Hunger Vital Sign    Worried About Running Out of Food in the Last Year: Never true    Ran Out of  Food in the Last Year: Never true  Transportation Needs: No Transportation Needs (10/23/2021)   PRAPARE - Hydrologist (Medical): No    Lack of Transportation (Non-Medical): No  Physical Activity: Not on file  Stress: Not on file  Social Connections: Not on file  Intimate Partner Violence: Not on file    Family History:  Family History  Problem Relation Age of Onset   Breast cancer Maternal Aunt    Breast cancer Maternal Aunt     Medications Merlina M. Johnsen had no medications administered during this visit. Current Outpatient Medications  Medication Sig Dispense Refill   SUMAtriptan (IMITREX) 50 MG tablet Take 1 tablet (50 mg total) by mouth every 2 (two) hours as needed for migraine. May repeat in 2 hours if headache persists or recurs. 10 tablet 0   No current facility-administered medications for this visit.    Allergies Goodys extra strength [aspirin-acetaminophen-caffeine]   Physical Exam:  BP 123/86   Pulse 94   Ht '5\' 5"'$  (1.651 m)   Wt 172 lb 3.2 oz (78.1 kg)   LMP 09/27/2021 (Exact Date)   BMI 28.66 kg/m  Body mass index is 28.66 kg/m.  General appearance: Well nourished, well developed female in no acute distress.  Neck:  Supple, normal appearance, and no thyromegaly  Cardiovascular: normal s1 and s2.  No murmurs, rubs or gallops. Respiratory:  Clear to auscultation bilateral. Normal respiratory effort Abdomen: positive bowel sounds and no masses, hernias; diffusely non tender to palpation, non distended Neuro/Psych:  Normal mood and affect.  Skin:  Warm and dry.  Lymphatic:  No inguinal lymphadenopathy.   Pelvic exam: is not limited by body habitus EGBUS: within normal limits Vagina: normal except large amounts of white, cottage cheese like d/c in the vault Cervix: normal appearing cervix without tenderness, discharge or lesions.  Uterus:  nonenlarged and non tender, mobile Adnexa:  normal adnexa and no mass, fullness,  tenderness Rectovaginal: deferred  See procedure note for embx.   Laboratory: UPT neg    Latest Ref Rng & Units 10/23/2021    3:22 PM 09/18/2021    3:06 PM 07/12/2021    2:56 PM  CBC  WBC 3.4 - 10.8 x10E3/uL 4.3  3.5  3.7   Hemoglobin 11.1 - 15.9 g/dL 9.0  9.1  6.8   Hematocrit 34.0 - 46.6 % 31.5  31.9  26.9   Platelets 150 - 450 x10E3/uL 234  251  337      Radiology: images reviewed and fibroid appears to be submucosal.  Narrative & Impression  CLINICAL DATA:  Menorrhagia, LMP 07/14/2021   EXAM: TRANSABDOMINAL AND TRANSVAGINAL ULTRASOUND OF PELVIS   TECHNIQUE: Both transabdominal and transvaginal ultrasound examinations of the pelvis were performed. Transabdominal technique was performed  for global imaging of the pelvis including uterus, ovaries, adnexal regions, and pelvic cul-de-sac. It was necessary to proceed with endovaginal exam following the transabdominal exam to visualize the endometrium and ovaries.   COMPARISON:  None   FINDINGS: Uterus   Measurements: 8.8 x 4.8 x 5.9 cm = volume: 130 mL. Large central mass at upper uterus 3.2 x 1.8 x 3.1 cm, question submucosal leiomyoma versus endometrial mass containing a few shadowing foci. No additional masses.   Endometrium   Obscured by large mass at upper central uterus   Right ovary   Measurements: 2.9 x 1.8 x 1.8 cm = volume: 4.9 mL. Cyst identified within RIGHT ovary containing a single thin septation, 2.4 cm in greatest dimension; this is a benign appearing cyst and no follow-up imaging is recommended.   Left ovary   Measurements: 3.4 x 1.9 x 1.7 cm = volume: 5.7 mL. Normal morphology without mass   Other findings   No free pelvic fluid.  No other adnexal masses.   IMPRESSION: 3.2 cm diameter mass at upper central uterus, obscuring endometrial complex, question submucosal leiomyoma versus endometrial mass; in the setting of menorrhagia, consider further evaluation with sonohysterogram to  differentiate endometrial versus myometrial mass prior to hysteroscopy. Endometrial sampling should also be considered if patient is at high risk for endometrial carcinoma. (Ref: Radiological Reasoning: Algorithmic Workup of Abnormal Vaginal Bleeding with Endovaginal Sonography and Sonohysterography. AJR 2008; 854:O27-03)     Electronically Signed   By: Lavonia Dana M.D.   On: 07/25/2021 18:37   Assessment: pt stable  Plan:  1. Cervical cancer screening - Cytology - PAP( [facility])  2. Right ovarian cyst Benign. Can assess at time of surgery  3. Anemia, unspecified type Pt not on PO iron. She is asymptomatic. Do anemia panel. She has done iv iron in the past and I told her if still low recommend repeat iv iron and can coordinate surgery after that.   4. Abnormal uterine bleeding (AUB) See above. Pt amenable to Lysteda trial.  She thinks she may have used it before and maybe used Lupron before in the past, as well, which she didn't like.   Options d/w her and I told her that I recommend some type of surgery. She is wanting a hysterectomy which is very reasonable given her history. I d/w re: the risks of surgery were discussed in detail with the patient including but not limited to: bleeding which may require transfusion or reoperation; infection which may require prolonged hospitalization or re-hospitalization and antibiotic therapy; injury to bowel, bladder, ureters and major vessels or other surrounding organs which may lead to other procedures; formation of adhesions; need for additional procedures including laparotomy or subsequent procedures secondary to intraoperative injury or abnormal pathology; thromboembolic phenomenon; incisional problems and other postoperative or anesthesia complications.  Patient was told that the likelihood that her condition and symptoms will be treated effectively with this surgical management was very high; the postoperative expectations were also  discussed in detail. The patient also understands the alternative treatment options which were discussed in full. All questions were answered.  I told her that I feel we can do it laparoscopically and recommend a possible hysteroscopy, TLH/BS/cysto which she is amenable to - Anemia Profile B - Surgical pathology( [place])  5. Vulvovaginal candidiasis F/u swab. Diflucan sent in  - Cervicovaginal ancillary only( [place])  Orders Placed This Encounter  Procedures   Anemia Profile B   Pregnancy, urine POC  RTC post op  Durene Romans MD Attending Center for [facility])

## 2021-10-24 ENCOUNTER — Encounter: Payer: Self-pay | Admitting: Obstetrics and Gynecology

## 2021-10-24 ENCOUNTER — Telehealth: Payer: Self-pay

## 2021-10-24 DIAGNOSIS — N83201 Unspecified ovarian cyst, right side: Secondary | ICD-10-CM | POA: Insufficient documentation

## 2021-10-24 DIAGNOSIS — D250 Submucous leiomyoma of uterus: Secondary | ICD-10-CM | POA: Insufficient documentation

## 2021-10-24 HISTORY — PX: ENDOMETRIAL BIOPSY: PRO73

## 2021-10-24 LAB — ANEMIA PROFILE B
Basophils Absolute: 0 10*3/uL (ref 0.0–0.2)
Basos: 1 %
EOS (ABSOLUTE): 0.2 10*3/uL (ref 0.0–0.4)
Eos: 4 %
Ferritin: 6 ng/mL — ABNORMAL LOW (ref 15–150)
Folate: 10.8 ng/mL (ref >3.0)
Hematocrit: 31.5 % — ABNORMAL LOW (ref 34.0–46.6)
Hemoglobin: 9 g/dL — ABNORMAL LOW (ref 11.1–15.9)
Immature Grans (Abs): 0 10*3/uL (ref 0.0–0.1)
Immature Granulocytes: 0 %
Iron Saturation: 3 % — CL (ref 15–55)
Iron: 13 ug/dL — ABNORMAL LOW (ref 27–159)
Lymphocytes Absolute: 1.6 10*3/uL (ref 0.7–3.1)
Lymphs: 37 %
MCH: 19.8 pg — ABNORMAL LOW (ref 26.6–33.0)
MCHC: 28.6 g/dL — ABNORMAL LOW (ref 31.5–35.7)
MCV: 69 fL — ABNORMAL LOW (ref 79–97)
Monocytes Absolute: 0.4 10*3/uL (ref 0.1–0.9)
Monocytes: 10 %
Neutrophils Absolute: 2.1 10*3/uL (ref 1.4–7.0)
Neutrophils: 48 %
Platelets: 234 10*3/uL (ref 150–450)
RBC: 4.55 x10E6/uL (ref 3.77–5.28)
RDW: 20.1 % — ABNORMAL HIGH (ref 11.7–15.4)
Retic Ct Pct: 0.8 % (ref 0.6–2.6)
Total Iron Binding Capacity: 457 ug/dL — ABNORMAL HIGH (ref 250–450)
UIBC: 444 ug/dL — ABNORMAL HIGH (ref 131–425)
Vitamin B-12: 604 pg/mL (ref 232–1245)
WBC: 4.3 10*3/uL (ref 3.4–10.8)

## 2021-10-24 LAB — CYTOLOGY - PAP
Chlamydia: NEGATIVE
Comment: NEGATIVE
Comment: NEGATIVE
Comment: NEGATIVE
Comment: NORMAL
Diagnosis: NEGATIVE
High risk HPV: NEGATIVE
Neisseria Gonorrhea: NEGATIVE
Trichomonas: NEGATIVE

## 2021-10-24 NOTE — Telephone Encounter (Addendum)
-----   Message from Aletha Halim, MD sent at 10/24/2021  8:28 AM EDT ----- Regarding: needs iv iron. order is in. thanks   [facility] and [facility] Short Stay; both calls to VM. Will attempt to call a second time.

## 2021-10-24 NOTE — Procedures (Addendum)
Endometrial Biopsy Procedure Note  Pre-operative Diagnosis: AUB. Fibroids. Anemia  Post-operative Diagnosis: same. Yeast infection  Procedure Details  Urine pregnancy test was done and result was negative.  The risks (including infection, bleeding, pain, and uterine perforation) and benefits of the procedure were explained to the patient and Written informed consent was obtained.  The patient was placed in the dorsal lithotomy position.  Bimanual exam showed the uterus to be in the neutral position.  A Graves' speculum inserted in the vagina, and the cervix was visualized and a pap smear and vaginal swab performed. The cervix was then prepped with povidone iodine, and a sharp tenaculum was applied to the anterior lip of the cervix for stabilization.  A pipelle was inserted into the uterine cavity and sounded the uterus to a depth of 8.5cm.  A Moderate amount of tissue was collected after 1 passes. The sample was sent for pathologic examination.  Condition: Stable  Complications: None  Plan: The patient was advised to call for any fever or for prolonged or severe pain or bleeding. She was advised to use OTC analgesics as needed for mild to moderate pain. She was advised to avoid vaginal intercourse for 48 hours or until the bleeding has completely stopped.  Durene Romans MD Attending Center for [employer])

## 2021-10-25 LAB — CERVICOVAGINAL ANCILLARY ONLY
Bacterial Vaginitis (gardnerella): POSITIVE — AB
Candida Glabrata: NEGATIVE
Candida Vaginitis: NEGATIVE
Comment: NEGATIVE
Comment: NEGATIVE
Comment: NEGATIVE

## 2021-10-25 LAB — SURGICAL PATHOLOGY

## 2021-10-25 MED ORDER — METRONIDAZOLE 500 MG PO TABS: 500.0000 mg | ORAL_TABLET | Freq: Two times a day (BID) | ORAL | 0 refills | Status: AC

## 2021-10-25 NOTE — Telephone Encounter (Signed)
Scheduled 6/21 @ 830 at Patient Teresa [name]. Called patient & discussed results with her and need for iron infusions. Reviewed appt information. Patient verbalized understanding to all.

## 2021-11-01 ENCOUNTER — Non-Acute Institutional Stay (HOSPITAL_COMMUNITY)
Admission: RE | Admit: 2021-11-01 | Discharge: 2021-11-01 | Disposition: A | Discharge status: Home / Self Care | Payer: BC Managed Care – PPO | Source: Ambulatory Visit | Attending: Internal Medicine | Admitting: Internal Medicine

## 2021-11-01 DIAGNOSIS — D649 Anemia, unspecified: Secondary | ICD-10-CM | POA: Diagnosis not present

## 2021-11-01 MED ORDER — SODIUM CHLORIDE 0.9 % IV SOLN
INTRAVENOUS | Status: DC | PRN
Start: 2021-11-01 — End: 2021-11-02

## 2021-11-01 MED ORDER — SODIUM CHLORIDE 0.9 % IV SOLN
300.0000 mg | INTRAVENOUS | Status: DC
  Administered 2021-11-01: 300 mg via INTRAVENOUS
  Filled 2021-11-01: qty 15

## 2021-11-01 NOTE — Progress Notes (Signed)
PATIENT CARE CENTER NOTE   Diagnosis: Anemia, unspecified type   Provider: Aletha Halim, MD   Procedure: Venofer infusion    Note:  Patient received Venofer 300 mg infusion (dose # 1 of 2) via PIV. No pre-medications ordered. Patient tolerated infusion well with no adverse reaction. Vital signs stable. Discharge instructions given. Patient to come back in 1 week for second infusion. Alert, oriented and ambulatory at discharge.

## 2021-11-06 ENCOUNTER — Encounter: Payer: Self-pay | Admitting: Obstetrics and Gynecology

## 2021-11-07 ENCOUNTER — Telehealth: Payer: Self-pay | Admitting: Obstetrics and Gynecology

## 2021-11-07 NOTE — Telephone Encounter (Signed)
error 

## 2021-11-08 ENCOUNTER — Non-Acute Institutional Stay (HOSPITAL_COMMUNITY)
Admission: RE | Admit: 2021-11-08 | Discharge: 2021-11-08 | Disposition: A | Discharge status: Home / Self Care | Payer: BC Managed Care – PPO | Source: Ambulatory Visit | Attending: Internal Medicine | Admitting: Internal Medicine

## 2021-11-08 DIAGNOSIS — D649 Anemia, unspecified: Secondary | ICD-10-CM | POA: Diagnosis not present

## 2021-11-08 MED ORDER — SODIUM CHLORIDE 0.9 % IV SOLN: INTRAVENOUS | Status: DC | PRN

## 2021-11-08 MED ORDER — SODIUM CHLORIDE 0.9 % IV SOLN
300.0000 mg | Freq: Once | INTRAVENOUS | Status: AC
  Administered 2021-11-08: 300 mg via INTRAVENOUS
  Filled 2021-11-08: qty 15

## 2021-11-08 NOTE — Progress Notes (Signed)
PATIENT CARE CENTER NOTE:  Provider: Aletha Halim MD  Diagnosis: Anemia, unspecified type  Procedure: Venofer '300mg'$    Patient received IV Venofer ( dose #2 of 2). Clarified with Derinda Late RN via Epic secure chat, that pt is to receive only two doses of iron. No premedications required per orders. Tolerated well with no adverse reaction, vitals stable, discharge instructions given , verbalized understanding. Patient alert, oriented, and ambulatory at the time of discharge.

## 2021-11-16 ENCOUNTER — Encounter: Payer: Self-pay | Admitting: Obstetrics and Gynecology

## 2021-12-04 ENCOUNTER — Other Ambulatory Visit: Payer: Self-pay | Admitting: Obstetrics and Gynecology

## 2021-12-04 DIAGNOSIS — Z01818 Encounter for other preprocedural examination: Secondary | ICD-10-CM

## 2021-12-05 ENCOUNTER — Other Ambulatory Visit: Payer: Self-pay

## 2021-12-05 ENCOUNTER — Encounter (HOSPITAL_BASED_OUTPATIENT_CLINIC_OR_DEPARTMENT_OTHER): Payer: Self-pay | Admitting: Obstetrics and Gynecology

## 2021-12-05 DIAGNOSIS — Z01812 Encounter for preprocedural laboratory examination: Secondary | ICD-10-CM | POA: Diagnosis not present

## 2021-12-05 NOTE — Progress Notes (Signed)
Your procedure is scheduled on Wednesday, 12/13/21.  Report to [place]. M.   Call this number if you have problems the morning of surgery  :[phone].   OUR ADDRESS IS [address].  WE ARE LOCATED IN [facility].  PLEASE BRING YOUR INSURANCE CARD AND PHOTO ID DAY OF SURGERY.  ONLY 2 PEOPLE ARE ALLOWED IN  WAITING  ROOM.                                      REMEMBER:  DO NOT EAT FOOD, CANDY GUM OR MINTS  AFTER MIDNIGHT THE NIGHT BEFORE YOUR SURGERY . YOU MAY HAVE CLEAR LIQUIDS FROM MIDNIGHT THE NIGHT BEFORE YOUR SURGERY UNTIL  9:15 AM. NO CLEAR LIQUIDS AFTER   9:15 AM DAY OF SURGERY.  YOU MAY  BRUSH YOUR TEETH MORNING OF SURGERY AND RINSE YOUR MOUTH OUT, NO CHEWING GUM CANDY OR MINTS.     CLEAR LIQUID DIET   Foods Allowed                                                                     Foods Excluded  Coffee and tea, regular and decaf                             liquids that you cannot  Plain Jell-O                                                                   see through such as: Fruit ices (not with fruit pulp)                                     milk, soups, orange juice  Plain  Popsicles                                    All solid food Carbonated beverages, regular and diet                                    Cranberry, grape and apple juices Sports drinks like Gatorade _____________________________________________________________________     TAKE THESE MEDICATIONS MORNING OF SURGERY: Imitrex if needed, Lysteda if needed    UP TO 4 VISITORS  MAY VISIT IN THE EXTENDED RECOVERY ROOM UNTIL 800 PM ONLY.  ONE  VISITOR AGE 48 AND OVER MAY SPEND THE NIGHT AND MUST BE IN EXTENDED RECOVERY ROOM NO LATER THAN 800 PM . YOUR DISCHARGE TIME AFTER YOU SPEND THE NIGHT IS 900 AM THE MORNING AFTER YOUR SURGERY.  YOU MAY PACK A SMALL OVERNIGHT BAG WITH TOILETRIES  FOR YOUR OVERNIGHT STAY IF YOU WISH.  YOUR PRESCRIPTION  MEDICATIONS WILL BE PROVIDED DURING [place].                                      DO NOT WEAR JEWERLY, MAKE UP. DO NOT WEAR LOTIONS, POWDERS, PERFUMES OR NAIL POLISH ON YOUR FINGERNAILS. TOENAIL POLISH IS OK TO WEAR. DO NOT SHAVE FOR 48 HOURS PRIOR TO DAY OF SURGERY. MEN MAY SHAVE FACE AND NECK. CONTACTS, GLASSES, OR DENTURES MAY NOT BE WORN TO SURGERY.  REMEMBER: NO SMOKING, DRUGS OR ALCOHOL FOR 24 HOURS BEFORE YOUR SURGERY.                                    [place] IS NOT RESPONSIBLE  FOR ANY BELONGINGS.                                                                    Marland Kitchen           [place] - Preparing for Surgery Before surgery, you can play an important role.  Because skin is not sterile, your skin needs to be as free of germs as possible.  You can reduce the number of germs on your skin by washing with CHG (chlorahexidine gluconate) soap before surgery.  CHG is an antiseptic cleaner which kills germs and bonds with the skin to continue killing germs even after washing. Please DO NOT use if you have an allergy to CHG or antibacterial soaps.  If your skin becomes reddened/irritated stop using the CHG and inform your nurse when you arrive at Short Stay. Do not shave (including legs and underarms) for at least 48 hours prior to the first CHG shower.  You may shave your face/neck. Please follow these instructions carefully:  1.  Shower with CHG Soap the night before surgery and the  morning of Surgery.  2.  If you choose to wash your hair, wash your hair first as usual with your  normal  shampoo.  3.  After you shampoo, rinse your hair and body thoroughly to remove the  shampoo.                            4.  Use CHG as you would any other liquid soap.  You can apply chg directly  to the skin and wash , please wash your belly button thoroughly with chg soap provided night before and morning of your surgery.                     Gently with a scrungie or clean washcloth.  5.   Apply the CHG Soap to your body ONLY FROM THE NECK DOWN.   Do not use on face/ open                           Wound or open sores. Avoid contact with eyes, ears mouth and genitals (private parts).  Wash face,  Genitals (private parts) with your normal soap.             6.  Wash thoroughly, paying special attention to the area where your surgery  will be performed.  7.  Thoroughly rinse your body with warm water from the neck down.  8.  DO NOT shower/wash with your normal soap after using and rinsing off  the CHG Soap.                9.  Pat yourself dry with a clean towel.            10.  Wear clean pajamas.            11.  Place clean sheets on your bed the night of your first shower and do not  sleep with pets. Day of Surgery : Do not apply any lotions/deodorants the morning of surgery.  Please wear clean clothes to the hospital/surgery center.  IF YOU HAVE ANY SKIN IRRITATION OR PROBLEMS WITH THE SURGICAL SOAP, PLEASE GET A BAR OF GOLD DIAL SOAP AND SHOWER THE NIGHT BEFORE YOUR SURGERY AND THE MORNING OF YOUR SURGERY. PLEASE LET THE NURSE KNOW MORNING OF YOUR SURGERY IF YOU HAD ANY PROBLEMS WITH THE SURGICAL SOAP.   ________________________________________________________________________                                                        QUESTIONS [facility] PRE OP NURSE PHONE [phone].

## 2021-12-05 NOTE — Progress Notes (Signed)
Spoke w/ via phone for pre-op interview---[facility] needs dos---- urine pregnancy              Lab results------12/08/21 lab appt for cbc, bmp, type & screen COVID test -----patient states asymptomatic no test needed Arrive at -------1015 on Wednesday, 12/13/21 NPO after MN NO Solid Food.  Clear liquids from MN until---0915 Med rec completed Medications to take morning of surgery -----Imitrex prn, Lysteda prn Diabetic medication -----n/a Patient instructed no nail polish to be worn day of surgery Patient instructed to bring photo id and insurance card day of surgery Patient aware to have Driver (ride ) / caregiver    for 24 hours after surgery - husband, Fritz Pickerel Patient Special Instructions -----Do not smoke marijuana within 24 hours of surgery. Extended / overnight stay instructions given. Pre-Op special Istructions -----none Patient verbalized understanding of instructions that were given at this phone interview. Patient denies shortness of breath, chest pain, fever, cough at this phone interview.

## 2021-12-08 ENCOUNTER — Encounter (HOSPITAL_COMMUNITY)
Admission: RE | Admit: 2021-12-08 | Discharge: 2021-12-08 | Disposition: A | Discharge status: Home / Self Care | Payer: BC Managed Care – PPO | Source: Ambulatory Visit | Attending: Obstetrics and Gynecology | Admitting: Obstetrics and Gynecology

## 2021-12-08 ENCOUNTER — Encounter: Payer: Self-pay | Admitting: Radiology

## 2021-12-08 DIAGNOSIS — Z01818 Encounter for other preprocedural examination: Secondary | ICD-10-CM

## 2021-12-08 DIAGNOSIS — Z01812 Encounter for preprocedural laboratory examination: Secondary | ICD-10-CM | POA: Insufficient documentation

## 2021-12-08 LAB — BASIC METABOLIC PANEL
Anion gap: 4 — ABNORMAL LOW (ref 5–15)
BUN: 13 mg/dL (ref 6–20)
CO2: 25 mmol/L (ref 22–32)
Calcium: 9.3 mg/dL (ref 8.9–10.3)
Chloride: 107 mmol/L (ref 98–111)
Creatinine, Ser: 0.8 mg/dL (ref 0.44–1.00)
GFR, Estimated: >60 mL/min (ref >60)
Glucose, Bld: 93 mg/dL (ref 70–99)
Potassium: 4.3 mmol/L (ref 3.5–5.1)
Sodium: 136 mmol/L (ref 135–145)

## 2021-12-08 LAB — CBC
HCT: 35.7 % — ABNORMAL LOW (ref 36.0–46.0)
Hemoglobin: 11.2 g/dL — ABNORMAL LOW (ref 12.0–15.0)
MCH: 22.5 pg — ABNORMAL LOW (ref 26.0–34.0)
MCHC: 31.4 g/dL (ref 30.0–36.0)
MCV: 71.7 fL — ABNORMAL LOW (ref 80.0–100.0)
Platelets: 312 10*3/uL (ref 150–400)
RBC: 4.98 MIL/uL (ref 3.87–5.11)
RDW: 23.9 % — ABNORMAL HIGH (ref 11.5–15.5)
WBC: 4.1 10*3/uL (ref 4.0–10.5)
nRBC: 0 % (ref 0.0–0.2)

## 2021-12-13 ENCOUNTER — Ambulatory Visit (HOSPITAL_BASED_OUTPATIENT_CLINIC_OR_DEPARTMENT_OTHER)
Admission: RE | Admit: 2021-12-13 | Discharge: 2021-12-13 | Disposition: A | Discharge status: Home / Self Care | Payer: BC Managed Care – PPO | Attending: Obstetrics and Gynecology | Admitting: Obstetrics and Gynecology

## 2021-12-13 ENCOUNTER — Ambulatory Visit (HOSPITAL_BASED_OUTPATIENT_CLINIC_OR_DEPARTMENT_OTHER): Payer: BC Managed Care – PPO | Admitting: Anesthesiology

## 2021-12-13 ENCOUNTER — Encounter (HOSPITAL_BASED_OUTPATIENT_CLINIC_OR_DEPARTMENT_OTHER)
Admission: RE | Disposition: A | Discharge status: Home / Self Care | Payer: Self-pay | Source: Home / Self Care | Attending: Obstetrics and Gynecology

## 2021-12-13 ENCOUNTER — Encounter (HOSPITAL_BASED_OUTPATIENT_CLINIC_OR_DEPARTMENT_OTHER): Payer: Self-pay | Admitting: Obstetrics and Gynecology

## 2021-12-13 ENCOUNTER — Other Ambulatory Visit: Payer: Self-pay

## 2021-12-13 DIAGNOSIS — Z9889 Other specified postprocedural states: Secondary | ICD-10-CM

## 2021-12-13 DIAGNOSIS — D250 Submucous leiomyoma of uterus: Secondary | ICD-10-CM | POA: Diagnosis not present

## 2021-12-13 DIAGNOSIS — N880 Leukoplakia of cervix uteri: Secondary | ICD-10-CM | POA: Diagnosis not present

## 2021-12-13 DIAGNOSIS — N920 Excessive and frequent menstruation with regular cycle: Secondary | ICD-10-CM | POA: Diagnosis not present

## 2021-12-13 DIAGNOSIS — D649 Anemia, unspecified: Secondary | ICD-10-CM | POA: Diagnosis not present

## 2021-12-13 DIAGNOSIS — N83201 Unspecified ovarian cyst, right side: Secondary | ICD-10-CM | POA: Diagnosis not present

## 2021-12-13 DIAGNOSIS — D759 Disease of blood and blood-forming organs, unspecified: Secondary | ICD-10-CM | POA: Diagnosis not present

## 2021-12-13 DIAGNOSIS — N939 Abnormal uterine and vaginal bleeding, unspecified: Secondary | ICD-10-CM | POA: Insufficient documentation

## 2021-12-13 DIAGNOSIS — D259 Leiomyoma of uterus, unspecified: Secondary | ICD-10-CM | POA: Insufficient documentation

## 2021-12-13 DIAGNOSIS — N938 Other specified abnormal uterine and vaginal bleeding: Secondary | ICD-10-CM | POA: Diagnosis not present

## 2021-12-13 DIAGNOSIS — Z01818 Encounter for other preprocedural examination: Secondary | ICD-10-CM

## 2021-12-13 DIAGNOSIS — G43909 Migraine, unspecified, not intractable, without status migrainosus: Secondary | ICD-10-CM | POA: Insufficient documentation

## 2021-12-13 DIAGNOSIS — D630 Anemia in neoplastic disease: Secondary | ICD-10-CM | POA: Diagnosis not present

## 2021-12-13 HISTORY — PX: CYSTOSCOPY: SHX5120

## 2021-12-13 HISTORY — PX: TOTAL LAPAROSCOPIC HYSTERECTOMY WITH SALPINGECTOMY: SHX6742

## 2021-12-13 HISTORY — DX: Other complications of anesthesia, initial encounter: T88.59XA

## 2021-12-13 HISTORY — DX: Presence of spectacles and contact lenses: Z97.3

## 2021-12-13 LAB — TYPE AND SCREEN
ABO/RH(D): O POS
Antibody Screen: NEGATIVE

## 2021-12-13 LAB — POCT PREGNANCY, URINE: Preg Test, Ur: NEGATIVE

## 2021-12-13 SURGERY — HYSTERECTOMY, TOTAL, LAPAROSCOPIC, WITH SALPINGECTOMY
Anesthesia: General | Site: Pelvis

## 2021-12-13 MED ORDER — DOCUSATE SODIUM 100 MG PO CAPS: 100.0000 mg | ORAL_CAPSULE | Freq: Two times a day (BID) | ORAL | 0 refills | Status: AC

## 2021-12-13 MED ORDER — MIDAZOLAM HCL 2 MG/2ML IJ SOLN
INTRAMUSCULAR | Status: AC
  Filled 2021-12-13: qty 2

## 2021-12-13 MED ORDER — BUPIVACAINE HCL 0.5 % IJ SOLN
INTRAMUSCULAR | Status: DC | PRN
  Administered 2021-12-13: 30 mL

## 2021-12-13 MED ORDER — PROPOFOL 1000 MG/100ML IV EMUL
INTRAVENOUS | Status: AC
  Filled 2021-12-13: qty 100

## 2021-12-13 MED ORDER — KETOROLAC TROMETHAMINE 30 MG/ML IJ SOLN
INTRAMUSCULAR | Status: DC | PRN
  Administered 2021-12-13: 15 mg via INTRAVENOUS

## 2021-12-13 MED ORDER — ONDANSETRON HCL 4 MG/2ML IJ SOLN
INTRAMUSCULAR | Status: AC
  Filled 2021-12-13: qty 2

## 2021-12-13 MED ORDER — SODIUM CHLORIDE 0.9 % IR SOLN
Status: DC | PRN
  Administered 2021-12-13: 1000 mL

## 2021-12-13 MED ORDER — AMISULPRIDE (ANTIEMETIC) 5 MG/2ML IV SOLN
INTRAVENOUS | Status: AC
  Filled 2021-12-13: qty 4

## 2021-12-13 MED ORDER — ROCURONIUM BROMIDE 10 MG/ML (PF) SYRINGE
PREFILLED_SYRINGE | INTRAVENOUS | Status: AC
  Filled 2021-12-13: qty 10

## 2021-12-13 MED ORDER — PROMETHAZINE HCL 25 MG/ML IJ SOLN
INTRAMUSCULAR | Status: AC
  Filled 2021-12-13: qty 1

## 2021-12-13 MED ORDER — CEFAZOLIN SODIUM-DEXTROSE 2-4 GM/100ML-% IV SOLN
INTRAVENOUS | Status: AC
  Filled 2021-12-13: qty 100

## 2021-12-13 MED ORDER — ONDANSETRON HCL 4 MG/2ML IJ SOLN
4.0000 mg | Freq: Once | INTRAMUSCULAR | Status: AC
  Administered 2021-12-13: 4 mg via INTRAVENOUS

## 2021-12-13 MED ORDER — AMISULPRIDE (ANTIEMETIC) 5 MG/2ML IV SOLN
10.0000 mg | Freq: Once | INTRAVENOUS | Status: AC | PRN
  Administered 2021-12-13: 10 mg via INTRAVENOUS

## 2021-12-13 MED ORDER — FLUORESCEIN SODIUM 10 % IV SOLN
INTRAVENOUS | Status: AC
  Filled 2021-12-13: qty 5

## 2021-12-13 MED ORDER — CEFAZOLIN SODIUM-DEXTROSE 2-4 GM/100ML-% IV SOLN
2.0000 g | INTRAVENOUS | Status: AC
  Administered 2021-12-13: 2 g via INTRAVENOUS

## 2021-12-13 MED ORDER — ROCURONIUM BROMIDE 10 MG/ML (PF) SYRINGE
PREFILLED_SYRINGE | INTRAVENOUS | Status: DC | PRN
  Administered 2021-12-13: 20 mg via INTRAVENOUS
  Administered 2021-12-13: 80 mg via INTRAVENOUS

## 2021-12-13 MED ORDER — FLUORESCEIN SODIUM 10 % IV SOLN
INTRAVENOUS | Status: DC | PRN
  Administered 2021-12-13: 25 mg via INTRAVENOUS

## 2021-12-13 MED ORDER — PROPOFOL 500 MG/50ML IV EMUL
INTRAVENOUS | Status: DC | PRN
  Administered 2021-12-13: 150 ug/kg/min via INTRAVENOUS

## 2021-12-13 MED ORDER — FENTANYL CITRATE (PF) 100 MCG/2ML IJ SOLN
INTRAMUSCULAR | Status: DC | PRN
  Administered 2021-12-13 (×3): 50 ug via INTRAVENOUS
  Administered 2021-12-13: 100 ug via INTRAVENOUS
  Administered 2021-12-13 (×2): 50 ug via INTRAVENOUS

## 2021-12-13 MED ORDER — ACETAMINOPHEN 500 MG PO TABS
ORAL_TABLET | ORAL | Status: AC
  Filled 2021-12-13: qty 2

## 2021-12-13 MED ORDER — PROPOFOL 10 MG/ML IV BOLUS
INTRAVENOUS | Status: DC | PRN
  Administered 2021-12-13: 100 mg via INTRAVENOUS
  Administered 2021-12-13: 50 mg via INTRAVENOUS
  Administered 2021-12-13: 150 mg via INTRAVENOUS

## 2021-12-13 MED ORDER — DEXAMETHASONE SODIUM PHOSPHATE 10 MG/ML IJ SOLN
INTRAMUSCULAR | Status: DC | PRN
  Administered 2021-12-13: 10 mg via INTRAVENOUS

## 2021-12-13 MED ORDER — FENTANYL CITRATE (PF) 250 MCG/5ML IJ SOLN
INTRAMUSCULAR | Status: AC
  Filled 2021-12-13: qty 5

## 2021-12-13 MED ORDER — SCOPOLAMINE 1 MG/3DAYS TD PT72
MEDICATED_PATCH | TRANSDERMAL | Status: AC
  Filled 2021-12-13: qty 1

## 2021-12-13 MED ORDER — PROPOFOL 10 MG/ML IV BOLUS
INTRAVENOUS | Status: AC
  Filled 2021-12-13: qty 20

## 2021-12-13 MED ORDER — 0.9 % SODIUM CHLORIDE (POUR BTL) OPTIME
TOPICAL | Status: DC | PRN
  Administered 2021-12-13: 500 mL

## 2021-12-13 MED ORDER — FENTANYL CITRATE (PF) 100 MCG/2ML IJ SOLN: 25.0000 ug | INTRAMUSCULAR | Status: DC | PRN

## 2021-12-13 MED ORDER — LIDOCAINE 2% (20 MG/ML) 5 ML SYRINGE
INTRAMUSCULAR | Status: DC | PRN
  Administered 2021-12-13: 60 mg via INTRAVENOUS

## 2021-12-13 MED ORDER — FENTANYL CITRATE (PF) 100 MCG/2ML IJ SOLN
INTRAMUSCULAR | Status: AC
  Filled 2021-12-13: qty 2

## 2021-12-13 MED ORDER — LACTATED RINGERS IV SOLN: INTRAVENOUS | Status: DC

## 2021-12-13 MED ORDER — OXYCODONE-ACETAMINOPHEN 5-325 MG PO TABS: 1.0000 | ORAL_TABLET | Freq: Four times a day (QID) | ORAL | 0 refills | Status: DC | PRN

## 2021-12-13 MED ORDER — ONDANSETRON HCL 4 MG/2ML IJ SOLN
INTRAMUSCULAR | Status: DC | PRN
  Administered 2021-12-13: 4 mg via INTRAVENOUS

## 2021-12-13 MED ORDER — SUGAMMADEX SODIUM 200 MG/2ML IV SOLN
INTRAVENOUS | Status: DC | PRN
  Administered 2021-12-13: 200 mg via INTRAVENOUS

## 2021-12-13 MED ORDER — PROMETHAZINE HCL 25 MG/ML IJ SOLN
6.2500 mg | INTRAMUSCULAR | Status: AC | PRN
  Administered 2021-12-13 (×2): 6.25 mg via INTRAVENOUS

## 2021-12-13 MED ORDER — IBUPROFEN 600 MG PO TABS: 600.0000 mg | ORAL_TABLET | Freq: Three times a day (TID) | ORAL | 0 refills | Status: AC | PRN

## 2021-12-13 MED ORDER — ACETAMINOPHEN 500 MG PO TABS
1000.0000 mg | ORAL_TABLET | Freq: Once | ORAL | Status: AC
Start: 2021-12-13 — End: 2021-12-13
  Administered 2021-12-13: 1000 mg via ORAL

## 2021-12-13 MED ORDER — MIDAZOLAM HCL 5 MG/5ML IJ SOLN
INTRAMUSCULAR | Status: DC | PRN
  Administered 2021-12-13: 2 mg via INTRAVENOUS

## 2021-12-13 MED ORDER — SCOPOLAMINE 1 MG/3DAYS TD PT72
1.0000 | MEDICATED_PATCH | TRANSDERMAL | Status: DC
  Administered 2021-12-13: 1.5 mg via TRANSDERMAL

## 2021-12-13 MED ORDER — DEXAMETHASONE SODIUM PHOSPHATE 10 MG/ML IJ SOLN
INTRAMUSCULAR | Status: AC
  Filled 2021-12-13: qty 1

## 2021-12-13 MED ORDER — LIDOCAINE HCL (PF) 2 % IJ SOLN
INTRAMUSCULAR | Status: AC
  Filled 2021-12-13: qty 5

## 2021-12-13 SURGICAL SUPPLY — 54 items
ADH SKN CLS APL DERMABOND .7 (GAUZE/BANDAGES/DRESSINGS) ×4
BARRIER ADHS 3X4 INTERCEED (GAUZE/BANDAGES/DRESSINGS) IMPLANT
BLADE SURG 15 STRL LF DISP TIS (BLADE) ×2 IMPLANT
BLADE SURG 15 STRL SS (BLADE) ×3
BRR ADH 4X3 ABS CNTRL BYND (GAUZE/BANDAGES/DRESSINGS)
CABLE HIGH FREQUENCY MONO STRZ (ELECTRODE) ×3 IMPLANT
DECANTER SPIKE VIAL GLASS SM (MISCELLANEOUS) ×3 IMPLANT
DEFOGGER SCOPE WARMER CLEARIFY (MISCELLANEOUS) ×3 IMPLANT
DERMABOND ADVANCED (GAUZE/BANDAGES/DRESSINGS) ×2
DERMABOND ADVANCED .7 DNX12 (GAUZE/BANDAGES/DRESSINGS) ×2 IMPLANT
DEVICE SUTURE ENDOST 10MM (ENDOMECHANICALS) ×1 IMPLANT
DEVICE TROCAR PUNCTURE CLOSURE (ENDOMECHANICALS) ×1 IMPLANT
DURAPREP 26ML APPLICATOR (WOUND CARE) ×3 IMPLANT
GAUZE 4X4 16PLY ~~LOC~~+RFID DBL (SPONGE) ×3 IMPLANT
GLOVE BIOGEL PI IND STRL 7.5 (GLOVE) ×4 IMPLANT
GLOVE BIOGEL PI IND STRL 8 (GLOVE) ×4 IMPLANT
GLOVE BIOGEL PI INDICATOR 7.5 (GLOVE) ×2
GLOVE BIOGEL PI INDICATOR 8 (GLOVE) ×2
GLOVE SURG SS PI 7.0 STRL IVOR (GLOVE) ×12 IMPLANT
GOWN STRL REUS W/TWL LRG LVL3 (GOWN DISPOSABLE) ×15 IMPLANT
HIBICLENS CHG 4% 4OZ BTL (MISCELLANEOUS) ×6 IMPLANT
KIT TURNOVER CYSTO (KITS) ×3 IMPLANT
L-HOOK LAP DISP 36CM (ELECTROSURGICAL) ×3
LHOOK LAP DISP 36CM (ELECTROSURGICAL) IMPLANT
LIGASURE VESSEL 5MM BLUNT TIP (ELECTROSURGICAL) ×1 IMPLANT
MANIPULATOR VCARE LG CRV RETR (MISCELLANEOUS) IMPLANT
MANIPULATOR VCARE SML CRV RETR (MISCELLANEOUS) IMPLANT
MANIPULATOR VCARE STD CRV RETR (MISCELLANEOUS) ×1 IMPLANT
OCCLUDER COLPOPNEUMO (BALLOONS) ×3 IMPLANT
PACK LAPAROSCOPY BASIN (CUSTOM PROCEDURE TRAY) ×3 IMPLANT
PACK TRENDGUARD 450 HYBRID PRO (MISCELLANEOUS) IMPLANT
PAD ARMBOARD 7.5X6 YLW CONV (MISCELLANEOUS) ×6 IMPLANT
PAD OB MATERNITY 4.3X12.25 (PERSONAL CARE ITEMS) ×3 IMPLANT
PENCIL BUTTON HOLSTER BLD 10FT (ELECTRODE) ×1 IMPLANT
POUCH LAPAROSCOPIC INSTRUMENT (MISCELLANEOUS) ×6 IMPLANT
SCISSORS LAP 5X35 DISP (ENDOMECHANICALS) ×3 IMPLANT
SET IRRIG Y TYPE TUR BLADDER L (SET/KITS/TRAYS/PACK) IMPLANT
SET SUCTION IRRIG HYDROSURG (IRRIGATION / IRRIGATOR) ×3 IMPLANT
SET TUBE SMOKE EVAC HIGH FLOW (TUBING) ×3 IMPLANT
SOLUTION ELECTROLUBE (MISCELLANEOUS) IMPLANT
SUT ENDO VLOC 180-0-4 (SUTURE) ×1 IMPLANT
SUT ENDO VLOC 180-0-8IN (SUTURE) IMPLANT
SUT MNCRL AB 4-0 PS2 18 (SUTURE) ×6 IMPLANT
SUT VICRYL 0 UR6 27IN ABS (SUTURE) ×6 IMPLANT
SYR 50ML LL SCALE MARK (SYRINGE) ×3 IMPLANT
SYSTEM CARTER THOMASON II (TROCAR) IMPLANT
TOWEL OR 17X26 10 PK STRL BLUE (TOWEL DISPOSABLE) ×3 IMPLANT
TRAY FOLEY W/BAG SLVR 14FR LF (SET/KITS/TRAYS/PACK) ×3 IMPLANT
TRENDGUARD 450 HYBRID PRO PACK (MISCELLANEOUS)
TROCAR ADV FIXATION 5X100MM (TROCAR) IMPLANT
TROCAR BALLN 12MMX100 BLUNT (TROCAR) ×1 IMPLANT
TROCAR XCEL NON-BLD 11X100MML (ENDOMECHANICALS) IMPLANT
UNDERPAD 30X36 HEAVY ABSORB (UNDERPADS AND DIAPERS) ×3 IMPLANT
vloca004l ×1 IMPLANT

## 2021-12-13 NOTE — H&P (Signed)
Obstetrics & Gynecology Surgical H&P   Date of Surgery: 12/13/2021   Primary OBGYN: [facility]  Reason for Admission:  scheduled hysterectomy  History of Present Illness: Teresa [name] is a 46 y.o. G1P1001 (Patient's last menstrual period was 11/22/2021.), with the above CC. PMHx is significant for h/o c/s x 1, BMI 29, h/o 2018 myosure myomectomy for submucosal fibroid, anemia, migraines.    Patient with long history of heavy bleeding, not really painful. She states the 2018 myosure helped for a few months but then her bleeding became heavy; op note states the submucsoal fibroid took up most of the cavity.  Her periods are qmonth but can be prolonged and can last up to three weeks.   Patient seen by me on 6/12 for new patient exam and had negative embx and hgb near her baseline at 9.0. Options d/w her and she elected for hysterectomy  ROS: A 12-point review of systems was performed and negative, except as stated in the above HPI.  OBGYN History: As per HPI. OB History  Gravida Para Term Preterm AB Living  '1 1 1 '$ 0 0 1  SAB IAB Ectopic Multiple Live Births  0 0 0 0 1    # Outcome Date GA Lbr Len/2nd Weight Sex Delivery Anes PTL Lv  1 Term 12/31/04    F CS-LTranv   LIV   Past Medical History: Past Medical History:  Diagnosis Date   Breast discharge    Complication of anesthesia    Pt experiences N & V after anethesia.   COVID-19 2021   mild symptoms, no hospitalization   History of recent blood transfusion 10/10/2016   x4 units RBCs   Menorrhagia    Migraines    follows with PCP, Geryl Rankins, NP   Severe anemia    DUE TO UTERINE BLEEDING FROM FIBROID-- s/p  4 units blood transfusion 10-10-2016 and hx IV iron, hx of IV iron (Venofer) in 2023.   Submucous uterine fibroid    Wears glasses    reading only    Past Surgical History: Past Surgical History:  Procedure Laterality Date   AXILLARY HIDRADENITIS EXCISION Left 02/12/2011   CESAREAN  SECTION  12/31/2004   DILATATION & CURETTAGE/HYSTEROSCOPY WITH MYOSURE N/A 10/23/2016   Procedure: DILATATION & CURETTAGE/HYSTEROSCOPY WITH MYOSURE;  Surgeon: Olga Millers, MD;  Location: [facility];  Service: Gynecology;  Laterality: N/A;   ENDOMETRIAL BIOPSY  10/24/2021   HYDRADENITIS EXCISION  01/07/2012   Procedure: EXCISION HYDRADENITIS AXILLA;  Surgeon: Cristine Polio, MD;  Location: [place];  Service: Plastics;  Laterality: N/A;  excision hydrradenitis right axilla with ryan pollock closure   LAPAROSCOPY  03/15/2000   destruction of hydatid of [place]    Family History:  Family History  Problem Relation Age of Onset   Breast cancer Maternal Aunt    Breast cancer Maternal Aunt     Social History:  Social History   Socioeconomic History   Marital status: Married    Spouse name: Not on file   Number of children: Not on file   Years of education: Not on file   Highest education level: Not on file  Occupational History   Not on file  Tobacco Use   Smoking status: Never   Smokeless tobacco: Never  Vaping Use   Vaping Use: Never used  Substance and Sexual Activity   Alcohol use: No   Drug use: Yes    Types: Marijuana    Comment:  last used on 12/03/21 as of 12/05/21   Sexual activity: Not Currently    Birth control/protection: None  Other Topics Concern   Not on file  Social History Narrative   Not on file   Social Determinants of Health   Financial Resource Strain: Not on file  Food Insecurity: No Food Insecurity (10/23/2021)   Hunger Vital Sign    Worried About Running Out of Food in the Last Year: Never true    Ran Out of Food in the Last Year: Never true  Transportation Needs: No Transportation Needs (10/23/2021)   PRAPARE - Hydrologist (Medical): No    Lack of Transportation (Non-Medical): No  Physical Activity: Not on file  Stress: Not on file  Social Connections: Not on file  Intimate Partner Violence: Not on  file   Allergy: Allergies  Allergen Reactions   Goodys Extra Strength [Aspirin-Acetaminophen-Caffeine] Nausea And Vomiting    Current Outpatient Medications: Medications Prior to Admission  Medication Sig Dispense Refill Last Dose   ibuprofen (ADVIL) 200 MG tablet Take 200 mg by mouth every 6 (six) hours as needed.   Past Week   SUMAtriptan (IMITREX) 50 MG tablet Take 1 tablet (50 mg total) by mouth every 2 (two) hours as needed for migraine. May repeat in 2 hours if headache persists or recurs. 10 tablet 0 Past Month   tranexamic acid (LYSTEDA) 650 MG TABS tablet Take 2 tablets (1,300 mg total) by mouth 3 (three) times daily. Take during menses for a maximum of five days 30 tablet 2 Past Month     Hospital Medications: Current Facility-Administered Medications  Medication Dose Route Frequency Provider Last Rate Last Admin   acetaminophen (TYLENOL) tablet 1,000 mg  1,000 mg Oral Once Woodrum, Chelsey L, MD       ceFAZolin (ANCEF) IVPB 2g/100 mL premix  2 g Intravenous On Call to Comstock, MD       lactated ringers infusion   Intravenous Continuous Effie Berkshire, MD       lactated ringers infusion   Intravenous Continuous Aletha Halim, MD         Physical Exam: VS pending  Body mass index is 28.62 kg/m. General appearance: Well nourished, well developed female in no acute distress.  Cardiovascular: S1, S2 normal, no murmur, rub or gallop, regular rate and rhythm Respiratory:  Clear to auscultation bilateral. Normal respiratory effort Abdomen: positive bowel sounds and no masses, hernias; diffusely non tender to palpation, non distended Neuro/Psych:  Normal mood and affect.  Skin:  Warm and dry.  Extremities: no clubbing, cyanosis, or edema.  Lymphatic:  No inguinal lymphadenopathy.   From June 2023   Pelvic exam: is not limited by body habitus EGBUS: within normal limits Vagina: normal except large amounts of white, cottage cheese like d/c in the  vault Cervix: normal appearing cervix without tenderness, discharge or lesions.  Uterus:  nonenlarged and non tender, mobile Adnexa:  normal adnexa and no mass, fullness, tenderness Rectovaginal: deferred  Laboratory: UPT: neg Recent Labs  Lab 12/08/21 1306  WBC 4.1  HGB 11.2*  HCT 35.7*  PLT 312   Recent Labs  Lab 12/08/21 1306  NA 136  K 4.3  CL 107  CO2 25  BUN 13  CREATININE 0.80  CALCIUM 9.3  GLUCOSE 93   No results for input(s): "APTT", "INR", "PTT" in the last 168 hours.  Invalid input(s): "DRHAPTT" Recent Labs  Lab 12/08/21 1306  [place] [facility]  Imaging: no new imaging Narrative & Impression  CLINICAL DATA:  Menorrhagia, LMP 07/14/2021   EXAM: TRANSABDOMINAL AND TRANSVAGINAL ULTRASOUND OF PELVIS   TECHNIQUE: Both transabdominal and transvaginal ultrasound examinations of the pelvis were performed. Transabdominal technique was performed for global imaging of the pelvis including uterus, ovaries, adnexal regions, and pelvic cul-de-sac. It was necessary to proceed with endovaginal exam following the transabdominal exam to visualize the endometrium and ovaries.   COMPARISON:  None   FINDINGS: Uterus   Measurements: 8.8 x 4.8 x 5.9 cm = volume: 130 mL. Large central mass at upper uterus 3.2 x 1.8 x 3.1 cm, question submucosal leiomyoma versus endometrial mass containing a few shadowing foci. No additional masses.   Endometrium   Obscured by large mass at upper central uterus   Right ovary   Measurements: 2.9 x 1.8 x 1.8 cm = volume: 4.9 mL. Cyst identified within RIGHT ovary containing a single thin septation, 2.4 cm in greatest dimension; this is a benign appearing cyst and no follow-up imaging is recommended.   Left ovary   Measurements: 3.4 x 1.9 x 1.7 cm = volume: 5.7 mL. Normal morphology without mass   Other findings   No free pelvic fluid.  No other adnexal masses.   IMPRESSION: 3.2 cm diameter mass at upper central uterus,  obscuring endometrial complex, question submucosal leiomyoma versus endometrial mass; in the setting of menorrhagia, consider further evaluation with sonohysterogram to differentiate endometrial versus myometrial mass prior to hysteroscopy. Endometrial sampling should also be considered if patient is at high risk for endometrial carcinoma. (Ref: Radiological Reasoning: Algorithmic Workup of Abnormal Vaginal Bleeding with Endovaginal Sonography and Sonohysterography. AJR 2008; 976:B34-19)     Electronically Signed   By: Lavonia Dana M.D.   On: 07/25/2021 18:37    Assessment: Teresa [name] is a 46 y.o. G1P1001 (Patient's last menstrual period was 11/22/2021.) here for scheduled hysterectomy; pt stable Plan: Plan d/w her and she is amenable with tlh/bs/cystoscopy and plan for d/c to home after surgery later today    Durene Romans MD Attending Center for [facility])

## 2021-12-13 NOTE — Discharge Instructions (Addendum)
Laparoscopic Surgery Discharge Instructions  Instructions Following Major Laparoscopic Surgery You have just undergone a major laparoscopic surgery.  The following list should answer your most common questions.  Although we will discuss your surgery and post-operative instructions with you prior to your discharge, this list will serve as a reminder if you fail to recall the details of what we discussed.  We will discuss your surgery once again in detail at your post-op visit in two to four weeks. If you haven't already done so, please call to make your appointment as soon as possible.  How you will feel: Although you have just undergone a major surgery, your recovery will be significantly shorter since the surgery was performed through much smaller incisions than the traditional approach.  You should feel slightly better each day.  If you suddenly feel much worse than the prior day, please call the clinic.  It's important during the early part of your recovery that you maintain some activity.  Walking is encouraged.  You will quicken your recovery by continued activity.  Incision:  Your incisions will be closed with dissolvable stitches or surgical adhesive (glue).  There may be Band-aids and/or Steri-strips covering your incisions.  If there is no drainage from the incisions you may remove the Band-aids in one to two days.  You may notice some minor bruising at the incision sites.  This is common and will resolve within several days.  Please inform us if the redness at the edges of your incision appears to be spreading.  If the skin around your incision becomes warm to the touch, or if you notice a pus-like drainage, please call the office.  Vaginal Discharge Following a Laparoscopic Hysterectomy: Minor vaginal bleeding or spotting is normal following a hysterectomy.  Bleeding similar to the amount of your period is excessive, and you should inform us of this immediately.  Vaginal spotting may continue  for several weeks following your surgery.  You may notice a yellowish discharge which occasionally occurs as the vaginal stitches dissolve, and may last for several weeks.  Sexual Activity Following a Hysterectomy: Do not have sexual intercourse or place tampons or douches in the vagina prior to your first office visit.  We will discuss when you may resume these activities at that visit.    Stairs/Driving/Activities: You may climb stairs if necessary.  If you've had general anesthesia, do not drive a car the rest of the day today.  You may begin light housework when you feel up to it, but avoid heavy lifting (more than 15-20lbs) or pushing until cleared for these activities by your physician.  Hygiene:  Do not soak your incisions.  Showers are acceptable but you may not take a bath or swim in a pool.  Cleanse your incisions daily with soap and water.  Medications:  Please resume taking any medications that you were taking prior to the surgery.  If we have prescribed any new medications for you, please take them as directed.  Constipation:  It is fairly common to experience some difficulty in moving your bowels following major surgery.  Being active will help to reduce this likelihood. A diet rich in fiber and plenty of liquids is desirable.  If you do become constipated, a mild laxative such as Miralax, Milk of Magnesia, or Metamucil, or a stool softener such as Colace, is recommended.  General Instructions: If you develop a fever of 100.5 degrees or higher, please call the office number(s) below for physician on call.     Post Anesthesia Home Care Instructions  Activity: Get plenty of rest for the remainder of the day. A responsible individual must stay with you for 24 hours following the procedure.  For the next 24 hours, DO NOT: -Drive a car -Paediatric nurse -Drink alcoholic beverages -Take any medication unless instructed by your physician -Make any legal decisions or sign important  papers.  Meals: Start with liquid foods such as gelatin or soup. Progress to regular foods as tolerated. Avoid greasy, spicy, heavy foods. If nausea and/or vomiting occur, drink only clear liquids until the nausea and/or vomiting subsides. Call your physician if vomiting continues.  Special Instructions/Symptoms: Your throat may feel dry or sore from the anesthesia or the breathing tube placed in your throat during surgery. If this causes discomfort, gargle with warm salt water. The discomfort should disappear within 24 hours.  If you had a scopolamine patch placed behind your ear for the management of post- operative nausea and/or vomiting:  1. The medication in the patch is effective for 72 hours, after which it should be removed.  Wrap patch in a tissue and discard in the trash. Wash hands thoroughly with soap and water. 2. You may remove the patch earlier than 72 hours if you experience unpleasant side effects which may include dry mouth, dizziness or visual disturbances. 3. Avoid touching the patch. Wash your hands with soap and water after contact with the patch. Please remove by August 5th, 2023.   May take acetaminophen (tylenol) again at 4:15pm if needed. Watch tylenol intake from other sources (percocet) and keep intake of acetaminophen to less then 4,'000mg'$ /24 hours

## 2021-12-13 NOTE — Brief Op Note (Signed)
12/13/2021  1:22 PM  PATIENT:  Teresa [name]  46 y.o. female  PRE-OPERATIVE DIAGNOSIS:  AUB Ovarian Cyst  POST-OPERATIVE DIAGNOSIS:  AUB Ovarian Cyst  PROCEDURE:  Procedure(s): TOTAL LAPAROSCOPIC HYSTERECTOMY WITH SALPINGECTOMY (Bilateral) CYSTOSCOPY (N/A)  SURGEON:  Surgeon(s) and Role:    * Aletha Halim, MD - Primary    * Radene Gunning, MD - Assisting  PHYSICIAN ASSISTANT:   ANESTHESIA:   local and general  EBL:  25 mL   BLOOD ADMINISTERED:none  DRAINS:  foley 164m UOP    LOCAL MEDICATIONS USED:  MARCAINE     SPECIMEN:  cervix, uterus, tubes  DISPOSITION OF SPECIMEN:  PATHOLOGY  COUNTS:  YES  TOURNIQUET:  * No tourniquets in log *  DICTATION: .Note written in EPIC  PLAN OF CARE: Discharge to home after PACU  PATIENT DISPOSITION:  PACU - hemodynamically stable.   Delay start of Pharmacological VTE agent (>24hrs) due to surgical blood loss or risk of bleeding: not applicable

## 2021-12-13 NOTE — Transfer of Care (Signed)
Immediate Anesthesia Transfer of Care Note  Patient: Teresa [name]  Procedure(s) Performed: Procedure(s) (LRB): TOTAL LAPAROSCOPIC HYSTERECTOMY WITH SALPINGECTOMY (Bilateral) CYSTOSCOPY (N/A)  Patient Location: PACU  Anesthesia Type: General  Level of Consciousness: awake, sedated, patient cooperative and responds to stimulation  Airway & Oxygen Therapy: Patient Spontanous Breathing and Patient connected to [place] oxygen  Post-op Assessment: Report given to PACU RN, Post -op Vital signs reviewed and stable and Patient moving all extremities  Post vital signs: Reviewed and stable  Complications: No apparent anesthesia complications

## 2021-12-13 NOTE — Op Note (Incomplete)
Operative Note   12/13/2021  PRE-OP DIAGNOSIS *Abnormal uterine bleeding *Anemia   POST-OP DIAGNOSIS *Same *Fibroid uterus   SURGEON: Surgeon(s) and Role:    * Aletha Halim, MD - Primary  ASSISTANT:    Radene Gunning, MD - Assisting  An experienced assistant was required given the standard of surgical care given the complexity of the case. This assistant was needed for exposure, dissection, suctioning, retraction, instrument exchange, and for overall help during the procedure.   PROCEDURE: Total laparoscopic hysterectomy, bilateral salpingectomy, cystoscopy  ANESTHESIA: General and local  ESTIMATED BLOOD LOSS:  67m  DRAINS: indwelling foley 1582mUOP   TOTAL IV FLUIDS: 170020mrystalloid  VTE PROPHYLAXIS: SCDs to the bilateral lower extremities  ANTIBIOTICS: Two grams of Cefazolin were given. within 1 hour of skin incision  SPECIMENS: cervix, uterus and bilateral fallopian tubes (sent together), pendunculated fibroid that came off during specimen removal through the vagina.   DISPOSITION: PACU - hemodynamically stable.  CONDITION: stable  COMPLICATIONS: None  FINDINGS: Normal EGBUS, vaginal vault and cervix and negative bimanual exam. On laparoscopy, normal liver and stomach edge and only adhesions were slight ones at the area of the bladder flap/prior c-section. 5mm56mdunculated fibroid mid fundal. Grossly normal cervix, uterus, tubes and ovaries. Normal cystoscopy, +efflux of sodium fluorescein from bilateral ureteral orifices.   DESCRIPTION OF PROCEDURE: After informed consent was obtained, the patient was taken to the operating room where anesthesia was obtained without difficulty. The patient was positioned in the dorsal lithotomy position in [place] her arms were carefully tucked at her sides and the usual precautions were taken.  She was prepped and draped in normal sterile fashion.  Time-out was performed and a Foley catheter was placed into the  bladder. A standard VCare uterine manipulator was then placed in the uterus without incident. Gloves were then changed, and after injection of local anesthesia, the open technique was used to place an infraumbilical 12-m67-YPoon trocar under direct visualization. The laparoscope was introduced and CO2 gas was infused for pneumoperitoneum to a pressure of 15 mm Hg and the area below inspected for injury.  The patient was placed in Trendelenburg and the bowel was displaced up into the upper abdomen, and the right and left lateral 5-mm ports and an 11 mm suprapubic port were placed under direct visualization of the laparoscope, after injection of local anesthesia.   The bilateral cornua were then cauterized with the Ligasure device, and transected at the cornua the tubes removed after serially cauterizing and cutting the mesosalpinx on both sides. Next, the round ligaments were divided on each side with the Ligasure and the retroperitoneal space was opened bilaterally.  A bladder flap was created and the bladder was dissected down off the lower uterine segment and cervix using endoshears and Ligasure.  The broad ligaments were then opened, and the uterine arteries were skeletonized bilaterally, sealed and divided with the Ligasure device.  A colpotomy was performed circumferentially along the ring with electrocautery and the cervix was incised from the vagina and the specimens was removed through the vagina.  A pneumo balloon was placed in the vagina and the vaginal cuff was then closed in a running continuous fashion using the EndoStitch technique with 2-0 V-Lock suture with careful attention to include the vaginal cuff angles and the vaginal mucosa within the closure.  The cystoscopy was then performed, with the above noted findings. The intraperitoneal pressure was dropped, and all planes of dissection, vascular pedicles and the vaginal cuff  were found to be hemostatic.    The suprapubic trocar was removed and the  fascia was closed with 0 vicryl suture using the Endostitch technique. The lateral trocars were removed under visualization.   Before the umbilical trocar was removed the CO2 gas was released.  The fascia there was closed with 0 vicryl suture.  The skin incision at the umbilicus was closed with a subcuticular stitch of 4-0 monocryl.  The remaining skin incisions were closed with Dermabond glue.  The patient tolerated the procedure well.  Sponge, lap and needle counts were correct x2.  The patient was taken to recovery room in excellent condition.  Durene Romans MD Attending Center for [facility])

## 2021-12-13 NOTE — Anesthesia Procedure Notes (Signed)
Procedure Name: Intubation Date/Time: 12/13/2021 12:55 AM  Performed by: Bonney Aid, CRNAPre-anesthesia Checklist: Patient identified, Emergency Drugs available, Suction available and Patient being monitored Patient Re-evaluated:Patient Re-evaluated prior to induction Oxygen Delivery Method: Circle system utilized Preoxygenation: Pre-oxygenation with 100% oxygen Induction Type: IV induction Ventilation: Mask ventilation without difficulty Laryngoscope Size: Mac and 3 Grade View: Grade I Tube type: Oral Tube size: 7.0 mm Number of attempts: 1 Airway Equipment and Method: Stylet Placement Confirmation: ETT inserted through vocal cords under direct vision, positive ETCO2 and breath sounds checked- equal and bilateral Secured at: 21 cm Tube secured with: Tape Dental Injury: Teeth and Oropharynx as per pre-operative assessment

## 2021-12-13 NOTE — Anesthesia Preprocedure Evaluation (Addendum)
Anesthesia Evaluation  Patient identified by MRN, date of birth, ID band Patient awake    Reviewed: Allergy & Precautions, NPO status , Patient's Chart, lab work & pertinent test results  History of Anesthesia Complications (+) PONV and history of anesthetic complications  Airway Mallampati: III  TM Distance: >3 FB Neck ROM: Full  Mouth opening: Limited Mouth Opening  Dental no notable dental hx. (+) Teeth Intact, Dental Advisory Given   Pulmonary neg pulmonary ROS,    Pulmonary exam normal breath sounds clear to auscultation       Cardiovascular negative cardio ROS Normal cardiovascular exam Rhythm:Regular Rate:Normal     Neuro/Psych  Headaches, negative psych ROS   GI/Hepatic negative GI ROS, Neg liver ROS,   Endo/Other  negative endocrine ROS  Renal/GU negative Renal ROS  negative genitourinary   Musculoskeletal negative musculoskeletal ROS (+)   Abdominal   Peds  Hematology  (+) Blood dyscrasia, anemia ,   Anesthesia Other Findings   Reproductive/Obstetrics                            Anesthesia Physical Anesthesia Plan  ASA: 2  Anesthesia Plan: General   Post-op Pain Management: Tylenol PO (pre-op)*   Induction: Intravenous  PONV Risk Score and Plan: 4 or greater and Midazolam, Dexamethasone, Ondansetron, Scopolamine patch - Pre-op and TIVA  Airway Management Planned: Oral ETT  Additional Equipment:   Intra-op Plan:   Post-operative Plan: Extubation in OR  Informed Consent: I have reviewed the patients History and Physical, chart, labs and discussed the procedure including the risks, benefits and alternatives for the proposed anesthesia with the patient or authorized representative who has indicated his/her understanding and acceptance.     Dental advisory given  Plan Discussed with: CRNA  Anesthesia Plan Comments:        Anesthesia Quick Evaluation

## 2021-12-14 ENCOUNTER — Encounter: Payer: Self-pay | Admitting: Obstetrics and Gynecology

## 2021-12-14 LAB — SURGICAL PATHOLOGY

## 2021-12-14 NOTE — Anesthesia Postprocedure Evaluation (Signed)
Anesthesia Post Note  Patient: Teresa [name]  Procedure(s) Performed: TOTAL LAPAROSCOPIC HYSTERECTOMY WITH SALPINGECTOMY (Bilateral: Pelvis) CYSTOSCOPY (Bladder)     Patient location during evaluation: PACU Anesthesia Type: General Level of consciousness: awake and alert Pain management: pain level controlled Vital Signs Assessment: post-procedure vital signs reviewed and stable Respiratory status: spontaneous breathing, nonlabored ventilation, respiratory function stable and patient connected to nasal cannula oxygen Cardiovascular status: blood pressure returned to baseline and stable Postop Assessment: no apparent nausea or vomiting Anesthetic complications: no   No notable events documented.  Last Vitals:  Vitals:   12/13/21 1620 12/13/21 1810  BP: 136/78 130/83  Pulse: 79   Resp: 14 16  Temp: 36.4 C 37 C  SpO2: 100% 100%    Last Pain:  Vitals:   12/13/21 1810  TempSrc:   PainSc: 6                  [name]

## 2021-12-15 ENCOUNTER — Encounter: Payer: Self-pay | Admitting: Obstetrics and Gynecology

## 2021-12-15 ENCOUNTER — Encounter (HOSPITAL_BASED_OUTPATIENT_CLINIC_OR_DEPARTMENT_OTHER): Payer: Self-pay | Admitting: Obstetrics and Gynecology

## 2021-12-16 ENCOUNTER — Encounter: Payer: Self-pay | Admitting: Radiology

## 2021-12-18 ENCOUNTER — Other Ambulatory Visit: Payer: Self-pay | Admitting: Obstetrics and Gynecology

## 2021-12-19 ENCOUNTER — Encounter: Payer: Self-pay | Admitting: Obstetrics and Gynecology

## 2021-12-21 ENCOUNTER — Other Ambulatory Visit: Payer: Self-pay

## 2021-12-21 ENCOUNTER — Telehealth: Payer: Self-pay | Admitting: Obstetrics and Gynecology

## 2021-12-21 ENCOUNTER — Ambulatory Visit (INDEPENDENT_AMBULATORY_CARE_PROVIDER_SITE_OTHER): Payer: BC Managed Care – PPO

## 2021-12-21 VITALS — BP 124/87 | HR 86 | Temp 98.6°F

## 2021-12-21 DIAGNOSIS — G8918 Other acute postprocedural pain: Secondary | ICD-10-CM | POA: Diagnosis not present

## 2021-12-21 DIAGNOSIS — R309 Painful micturition, unspecified: Secondary | ICD-10-CM

## 2021-12-21 DIAGNOSIS — Z5189 Encounter for other specified aftercare: Secondary | ICD-10-CM | POA: Diagnosis not present

## 2021-12-21 LAB — POCT URINALYSIS DIP (DEVICE)
Bilirubin Urine: NEGATIVE
Glucose, UA: NEGATIVE mg/dL
Hgb urine dipstick: NEGATIVE
Ketones, ur: NEGATIVE mg/dL
Leukocytes,Ua: NEGATIVE
Nitrite: NEGATIVE
Protein, ur: NEGATIVE mg/dL
Specific Gravity, Urine: 1.02 (ref 1.005–1.030)
Urobilinogen, UA: 0.2 mg/dL (ref 0.0–1.0)
pH: 6 (ref 5.0–8.0)

## 2021-12-21 MED ORDER — DICLOFENAC SODIUM 75 MG PO TBEC: 75.0000 mg | DELAYED_RELEASE_TABLET | Freq: Two times a day (BID) | ORAL | 0 refills | Status: AC

## 2021-12-21 NOTE — Progress Notes (Signed)
Incision Check Visit  Teresa [name] is here for incision check following laparascopic hysterectomy with salpingectomy. 5 lap sites visualized; all clean and dry with intact, transparent dermabond. Reviewed s/s of infection with patient which pt denies. Vitals WNL today. Pt reports abdominal pain that worsens with movement and urination. UA today WNL. Also reports mid to lower back pain. Describes pain as moderate, but unable to complete daily tasks. Pt endorses regular, daily BM that is not hard to pass. Reviewed with Kennon Rounds, MD who recommends pt add Voltaren 75 mg BID and continue to take Tylenol 1000 mg every 6 hours. Pt agreeable to plan. Encouraged pt to rest as able and to continue to advance activity as she is comfortable. Will follow up at routine post-op or before if pain does not improve.   Annabell Howells, RN 12/21/2021  1:58 PM

## 2021-12-21 NOTE — Telephone Encounter (Signed)
GYN Telephone Note Patient called and said she's having some bleeding at the incisions and burning. Message sent to the office for incision check today. Pt told it will be an RN visit since I'm post call. If no e/o infection, can consider adding on gabapentin 200 bid for the incisional pain  Durene Romans MD Attending Center for [place] (Faculty Practice) 12/21/2021 Time: [phone]

## 2021-12-25 ENCOUNTER — Encounter: Payer: Self-pay | Admitting: Obstetrics and Gynecology

## 2021-12-25 DIAGNOSIS — G8918 Other acute postprocedural pain: Secondary | ICD-10-CM

## 2021-12-25 MED ORDER — GABAPENTIN 100 MG PO CAPS: 200.0000 mg | ORAL_CAPSULE | Freq: Two times a day (BID) | ORAL | 0 refills | Status: AC

## 2021-12-25 NOTE — Telephone Encounter (Unsigned)
No s/s of infection at nurse visit on 12/21/21. Gabapentin sent per Ilda Basset MD, see documentation below:  GYN Telephone Note Patient called and said she's having some bleeding at the incisions and burning. Message sent to the office for incision check today. Pt told it will be an RN visit since I'm post call. If no e/o infection, can consider adding on gabapentin 200 bid for the incisional pain   Durene Romans MD Attending Center for [place] (Faculty Practice) 12/21/2021 Time: [phone]

## 2022-01-02 ENCOUNTER — Encounter: Payer: Self-pay | Admitting: Obstetrics and Gynecology

## 2022-01-07 ENCOUNTER — Encounter: Payer: Self-pay | Admitting: Radiology

## 2022-01-17 ENCOUNTER — Other Ambulatory Visit: Payer: Self-pay | Admitting: Family Medicine

## 2022-01-17 ENCOUNTER — Encounter: Payer: Self-pay | Admitting: Obstetrics and Gynecology

## 2022-01-17 DIAGNOSIS — G8918 Other acute postprocedural pain: Secondary | ICD-10-CM

## 2022-01-25 ENCOUNTER — Other Ambulatory Visit: Payer: Self-pay

## 2022-01-25 ENCOUNTER — Encounter: Payer: Self-pay | Admitting: Obstetrics and Gynecology

## 2022-01-25 ENCOUNTER — Other Ambulatory Visit (HOSPITAL_COMMUNITY)
Admission: RE | Admit: 2022-01-25 | Discharge: 2022-01-25 | Disposition: A | Discharge status: Home / Self Care | Payer: BC Managed Care – PPO | Source: Ambulatory Visit | Attending: Obstetrics and Gynecology | Admitting: Obstetrics and Gynecology

## 2022-01-25 ENCOUNTER — Ambulatory Visit (INDEPENDENT_AMBULATORY_CARE_PROVIDER_SITE_OTHER): Payer: BC Managed Care – PPO | Admitting: Obstetrics and Gynecology

## 2022-01-25 VITALS — BP 135/87 | HR 91 | Ht 65.0 in | Wt 174.5 lb

## 2022-01-25 DIAGNOSIS — R102 Pelvic and perineal pain: Secondary | ICD-10-CM | POA: Diagnosis not present

## 2022-01-25 DIAGNOSIS — N760 Acute vaginitis: Secondary | ICD-10-CM | POA: Insufficient documentation

## 2022-01-25 DIAGNOSIS — Z09 Encounter for follow-up examination after completed treatment for conditions other than malignant neoplasm: Secondary | ICD-10-CM | POA: Diagnosis not present

## 2022-01-25 HISTORY — DX: Acute vaginitis: N76.0

## 2022-01-25 LAB — POCT URINALYSIS DIP (DEVICE)
Bilirubin Urine: NEGATIVE
Glucose, UA: NEGATIVE mg/dL
Ketones, ur: NEGATIVE mg/dL
Leukocytes,Ua: NEGATIVE
Nitrite: NEGATIVE
Protein, ur: NEGATIVE mg/dL
Specific Gravity, Urine: 1.02 (ref 1.005–1.030)
Urobilinogen, UA: 0.2 mg/dL (ref 0.0–1.0)
pH: 5.5 (ref 5.0–8.0)

## 2022-01-25 MED ORDER — METRONIDAZOLE 500 MG PO TABS: 500.0000 mg | ORAL_TABLET | Freq: Two times a day (BID) | ORAL | 0 refills | Status: AC

## 2022-01-25 MED ORDER — FLUCONAZOLE 150 MG PO TABS: 150.0000 mg | ORAL_TABLET | ORAL | 0 refills | Status: DC

## 2022-01-25 MED ORDER — SULFAMETHOXAZOLE-TRIMETHOPRIM 800-160 MG PO TABS: 1.0000 | ORAL_TABLET | Freq: Two times a day (BID) | ORAL | 0 refills | Status: AC

## 2022-01-25 NOTE — Progress Notes (Signed)
Obstetrics and Gynecology Visit Return Patient Evaluation  Appointment Date: 01/25/2022  Primary Care Provider: Fleming, [place] for [facility]  Chief Complaint: routine post op visit  History of Present Illness:  Teresa [name] is a 46 y.o. s/p 8/2 TLH/BS/cysto for fibroid; pt discharged to home from the pacu  Patient endorses lower pelvic pain and pressure and low back pain. Nothing per vagina since surgery, no bleeding  Final pathology negative.   Review of Systems: as noted in the History of Present Illness.  Medications:  Arianis M. Mears had no medications administered during this visit. Current Outpatient Medications  Medication Sig Dispense Refill   acetaminophen (TYLENOL) 500 MG tablet Take 1,000 mg by mouth every 6 (six) hours as needed.     diclofenac (VOLTAREN) 75 MG EC tablet Take 1 tablet (75 mg total) by mouth 2 (two) times daily with a meal. 60 tablet 0   gabapentin (NEURONTIN) 100 MG capsule Take 2 capsules (200 mg total) by mouth 2 (two) times daily. 30 capsule 0   ibuprofen (ADVIL) 600 MG tablet Take 1 tablet (600 mg total) by mouth every 8 (eight) hours as needed. (Patient not taking: Reported on 12/21/2021) 30 tablet 0   oxyCODONE-acetaminophen (PERCOCET/ROXICET) 5-325 MG tablet Take 1-2 tablets by mouth every 6 (six) hours as needed. (Patient not taking: Reported on 12/21/2021) 15 tablet 0   SUMAtriptan (IMITREX) 50 MG tablet Take 1 tablet (50 mg total) by mouth every 2 (two) hours as needed for migraine. May repeat in 2 hours if headache persists or recurs. 10 tablet 0   No current facility-administered medications for this visit.    Allergies: is allergic to goodys extra strength [aspirin-acetaminophen-caffeine].  Physical Exam:  BP 135/87   Pulse 91   Ht '5\' 5"'$  (1.651 m)   Wt 174 lb 8 oz (79.2 kg)   LMP 11/22/2021   BMI 29.04 kg/m  Body mass index is 29.04 kg/m. General appearance: Well nourished, well  developed female in no acute distress.  Abdomen: diffusely non tender to palpation, non distended, and no masses, hernias. Well healed l/s port sites. Negative hernia with abdominal contraction Neuro/Psych:  Normal mood and affect.    Pelvic exam:  EGBUS: normal Vaginal vault: white cottage cheese like d/c and white/yellow malodorous discharge. Pain replicated on bimanual. Cuff visually and manually intact with mild erythema   Assessment: pt stable with cuff cellulitis  Plan:  1. Vaginal pain Bactrim/flagyl/diflucan sent in No e/o abscess.  - Cervicovaginal ancillary only( [place]) - Urine Culture  2. Postop check  3. Vaginal cuff cellulitis   RTC: 1wk for repeat exam.   Durene Romans MD Attending Center for [facility])

## 2022-01-25 NOTE — Progress Notes (Signed)
Pt is having a lot of pressure & pain in back, when she stretches it feels like something is pulling & pain meds have not been helping.

## 2022-01-26 LAB — CERVICOVAGINAL ANCILLARY ONLY
Bacterial Vaginitis (gardnerella): POSITIVE — AB
Candida Glabrata: NEGATIVE
Candida Vaginitis: NEGATIVE
Comment: NEGATIVE
Comment: NEGATIVE
Comment: NEGATIVE

## 2022-01-27 LAB — URINE CULTURE

## 2022-01-30 ENCOUNTER — Encounter: Payer: Self-pay | Admitting: Obstetrics and Gynecology

## 2022-02-01 ENCOUNTER — Ambulatory Visit (INDEPENDENT_AMBULATORY_CARE_PROVIDER_SITE_OTHER): Payer: BC Managed Care – PPO | Admitting: Obstetrics and Gynecology

## 2022-02-01 ENCOUNTER — Encounter: Payer: Self-pay | Admitting: Obstetrics and Gynecology

## 2022-02-01 ENCOUNTER — Other Ambulatory Visit: Payer: Self-pay

## 2022-02-01 ENCOUNTER — Encounter: Payer: Self-pay | Admitting: Family Medicine

## 2022-02-01 VITALS — BP 131/89 | HR 99 | Wt 175.3 lb

## 2022-02-01 DIAGNOSIS — G8918 Other acute postprocedural pain: Secondary | ICD-10-CM | POA: Diagnosis not present

## 2022-02-01 DIAGNOSIS — B3731 Acute candidiasis of vulva and vagina: Secondary | ICD-10-CM

## 2022-02-01 DIAGNOSIS — R7989 Other specified abnormal findings of blood chemistry: Secondary | ICD-10-CM

## 2022-02-01 DIAGNOSIS — N760 Acute vaginitis: Secondary | ICD-10-CM

## 2022-02-01 MED ORDER — FLUCONAZOLE 150 MG PO TABS: 150.0000 mg | ORAL_TABLET | ORAL | 0 refills | Status: AC

## 2022-02-01 NOTE — Progress Notes (Signed)
Obstetrics and Gynecology Visit Return Patient Evaluation  Appointment Date: 02/01/2022  Primary Care Provider: Fleming, [place] for [facility]  Chief Complaint: post op pain follow up  History of Present Illness:  Teresa [name] is a 46 y.o. seen on 9/14 for routine follow up after 8/2 TLH/BS/cysto for fibroids; patient discharged to home from pacu.   On 9/14, she had lower pelvic and back pain and exam and s/s felt to be from possible cuff cellulitis so patient put on two weeks of bactrim and flagyl and RTC for follow up. Ucx neg and swab only showed BV.   Interval History: Since that time, she states that pain is the same, no GI or urinary s/s. Still nothing per vagina and she is taking the abx as prescribed.   Review of Systems: as noted in the History of Present Illness.   Patient Active Problem List   Diagnosis Date Noted   Vaginal cuff cellulitis 01/25/2022   Fibroids, submucosal 10/24/2021   Abnormal uterine bleeding (AUB) 10/23/2021   Anemia 11/21/2011   Medications:  Teresa [name] had no medications administered during this visit. Current Outpatient Medications  Medication Sig Dispense Refill   acetaminophen (TYLENOL) 500 MG tablet Take 1,000 mg by mouth every 6 (six) hours as needed.     diclofenac (VOLTAREN) 75 MG EC tablet Take 1 tablet (75 mg total) by mouth 2 (two) times daily with a meal. 60 tablet 0   fluconazole (DIFLUCAN) 150 MG tablet Take 1 tablet (150 mg total) by mouth every 3 (three) days for 3 doses. 3 tablet 0   gabapentin (NEURONTIN) 100 MG capsule Take 2 capsules (200 mg total) by mouth 2 (two) times daily. 30 capsule 0   metroNIDAZOLE (FLAGYL) 500 MG tablet Take 1 tablet (500 mg total) by mouth 2 (two) times daily for 14 days. 28 tablet 0   sulfamethoxazole-trimethoprim (BACTRIM DS) 800-160 MG tablet Take 1 tablet by mouth 2 (two) times daily for 14 days. 28 tablet 0   SUMAtriptan (IMITREX) 50 MG tablet  Take 1 tablet (50 mg total) by mouth every 2 (two) hours as needed for migraine. May repeat in 2 hours if headache persists or recurs. 10 tablet 0   ibuprofen (ADVIL) 600 MG tablet Take 1 tablet (600 mg total) by mouth every 8 (eight) hours as needed. (Patient not taking: Reported on 12/21/2021) 30 tablet 0   oxyCODONE-acetaminophen (PERCOCET/ROXICET) 5-325 MG tablet Take 1-2 tablets by mouth every 6 (six) hours as needed. (Patient not taking: Reported on 12/21/2021) 15 tablet 0   No current facility-administered medications for this visit.    Allergies: is allergic to goodys extra strength [aspirin-acetaminophen-caffeine].  Physical Exam:  BP 131/89   Pulse 99   Wt 175 lb 4.8 oz (79.5 kg)   LMP 11/22/2021   BMI 29.17 kg/m  Body mass index is 29.17 kg/m. General appearance: Well nourished, well developed female in no acute distress.  Abdomen: diffusely non tender to palpation, non distended, and no masses, hernias Neuro/Psych:  Normal mood and affect.    Pelvic exam:  EGBUS: normal Vaginal vault: no blood, +white cottage cheese like d/c in vault. No yellowish d/c like seen last week Cuff: still intact, looks good. Mildly ttp Bimanual: negative    Assessment: patient stable  Plan:  1. Post-op pain Unsure etiology but if there was a cuff cellulitis, her exam looks much better and no e/o fluctuance on bimanual exam. Will get CT  and check basic labs - CT ABDOMEN PELVIS W CONTRAST; Future   RTC: 2 wks  Durene Romans MD Attending Center for [facility])

## 2022-02-02 LAB — CBC WITH DIFFERENTIAL/PLATELET
Basophils Absolute: 0 10*3/uL (ref 0.0–0.2)
Basos: 1 %
EOS (ABSOLUTE): 0.1 10*3/uL (ref 0.0–0.4)
Eos: 3 %
Hematocrit: 38.9 % (ref 34.0–46.6)
Hemoglobin: 11.9 g/dL (ref 11.1–15.9)
Immature Grans (Abs): 0 10*3/uL (ref 0.0–0.1)
Immature Granulocytes: 0 %
Lymphocytes Absolute: 1.7 10*3/uL (ref 0.7–3.1)
Lymphs: 42 %
MCH: 23.5 pg — ABNORMAL LOW (ref 26.6–33.0)
MCHC: 30.6 g/dL — ABNORMAL LOW (ref 31.5–35.7)
MCV: 77 fL — ABNORMAL LOW (ref 79–97)
Monocytes Absolute: 0.4 10*3/uL (ref 0.1–0.9)
Monocytes: 10 %
Neutrophils Absolute: 1.8 10*3/uL (ref 1.4–7.0)
Neutrophils: 44 %
Platelets: 206 10*3/uL (ref 150–450)
RBC: 5.07 x10E6/uL (ref 3.77–5.28)
RDW: 20.3 % — ABNORMAL HIGH (ref 11.7–15.4)
WBC: 4.1 10*3/uL (ref 3.4–10.8)

## 2022-02-02 LAB — COMPREHENSIVE METABOLIC PANEL
ALT: 12 IU/L (ref 0–32)
AST: 13 IU/L (ref 0–40)
Albumin/Globulin Ratio: 1.7 (ref 1.2–2.2)
Albumin: 4.4 g/dL (ref 3.9–4.9)
Alkaline Phosphatase: 84 IU/L (ref 44–121)
BUN/Creatinine Ratio: 7 — ABNORMAL LOW (ref 9–23)
BUN: 9 mg/dL (ref 6–24)
Bilirubin Total: <0.2 mg/dL (ref 0.0–1.2)
CO2: 24 mmol/L (ref 20–29)
Calcium: 9.5 mg/dL (ref 8.7–10.2)
Chloride: 103 mmol/L (ref 96–106)
Creatinine, Ser: 1.23 mg/dL — ABNORMAL HIGH (ref 0.57–1.00)
Globulin, Total: 2.6 g/dL (ref 1.5–4.5)
Glucose: 90 mg/dL (ref 70–99)
Potassium: 4.5 mmol/L (ref 3.5–5.2)
Sodium: 140 mmol/L (ref 134–144)
Total Protein: 7 g/dL (ref 6.0–8.5)
eGFR: 55 mL/min/{1.73_m2} — ABNORMAL LOW (ref >59)

## 2022-02-02 LAB — LIPASE: Lipase: 23 U/L (ref 14–72)

## 2022-02-02 LAB — AMYLASE: Amylase: 78 U/L (ref 31–110)

## 2022-02-03 NOTE — Addendum Note (Signed)
Addended by: Aletha Halim on: 02/03/2022 05:40 PM   Modules accepted: Orders

## 2022-02-04 ENCOUNTER — Ambulatory Visit (HOSPITAL_BASED_OUTPATIENT_CLINIC_OR_DEPARTMENT_OTHER): Payer: BC Managed Care – PPO

## 2022-02-04 ENCOUNTER — Encounter: Payer: Self-pay | Admitting: Radiology

## 2022-02-04 ENCOUNTER — Encounter: Payer: Self-pay | Admitting: Obstetrics and Gynecology

## 2022-02-05 ENCOUNTER — Telehealth: Payer: Self-pay | Admitting: *Deleted

## 2022-02-05 ENCOUNTER — Other Ambulatory Visit: Payer: Self-pay | Admitting: *Deleted

## 2022-02-05 DIAGNOSIS — R7989 Other specified abnormal findings of blood chemistry: Secondary | ICD-10-CM

## 2022-02-05 NOTE — Telephone Encounter (Signed)
Called pt and discussed need for Renal ultrasound as recommended by Dr. Ilda Basset.  Appt has been scheduled on 9/26 @ [facility] @ 4pm.  She will need to arrive @ 3:30 pm and have a full bladder. Pt also needs lab test on Wednesday in our office.  She agreed to lab appt @ 1:30 Wednesday 9/27. Pt voiced understanding of all information and instructions given.

## 2022-02-06 ENCOUNTER — Ambulatory Visit (HOSPITAL_COMMUNITY)
Admission: RE | Admit: 2022-02-06 | Discharge: 2022-02-06 | Disposition: A | Discharge status: Home / Self Care | Payer: BC Managed Care – PPO | Source: Ambulatory Visit | Attending: Obstetrics and Gynecology | Admitting: Obstetrics and Gynecology

## 2022-02-06 DIAGNOSIS — R7989 Other specified abnormal findings of blood chemistry: Secondary | ICD-10-CM

## 2022-02-07 ENCOUNTER — Other Ambulatory Visit: Payer: BC Managed Care – PPO

## 2022-02-07 ENCOUNTER — Other Ambulatory Visit: Payer: Self-pay

## 2022-02-07 DIAGNOSIS — R7989 Other specified abnormal findings of blood chemistry: Secondary | ICD-10-CM

## 2022-02-08 LAB — BASIC METABOLIC PANEL
BUN/Creatinine Ratio: 10 (ref 9–23)
BUN: 8 mg/dL (ref 6–24)
CO2: 24 mmol/L (ref 20–29)
Calcium: 9.7 mg/dL (ref 8.7–10.2)
Chloride: 102 mmol/L (ref 96–106)
Creatinine, Ser: 0.83 mg/dL (ref 0.57–1.00)
Glucose: 90 mg/dL (ref 70–99)
Potassium: 4.3 mmol/L (ref 3.5–5.2)
Sodium: 138 mmol/L (ref 134–144)
eGFR: 87 mL/min/{1.73_m2} (ref >59)

## 2022-02-11 ENCOUNTER — Ambulatory Visit (HOSPITAL_BASED_OUTPATIENT_CLINIC_OR_DEPARTMENT_OTHER)
Admission: RE | Admit: 2022-02-11 | Discharge: 2022-02-11 | Disposition: A | Discharge status: Home / Self Care | Payer: BC Managed Care – PPO | Source: Ambulatory Visit | Attending: Obstetrics and Gynecology | Admitting: Obstetrics and Gynecology

## 2022-02-11 DIAGNOSIS — G8918 Other acute postprocedural pain: Secondary | ICD-10-CM | POA: Insufficient documentation

## 2022-02-11 DIAGNOSIS — K7689 Other specified diseases of liver: Secondary | ICD-10-CM | POA: Diagnosis not present

## 2022-02-11 MED ORDER — IOHEXOL 300 MG/ML  SOLN
100.0000 mL | Freq: Once | INTRAMUSCULAR | Status: AC | PRN
  Administered 2022-02-11: 80 mL via INTRAVENOUS

## 2022-02-13 ENCOUNTER — Encounter: Payer: Self-pay | Admitting: Obstetrics and Gynecology

## 2022-02-14 ENCOUNTER — Encounter: Payer: Self-pay | Admitting: Obstetrics and Gynecology

## 2022-03-05 ENCOUNTER — Ambulatory Visit: Payer: BC Managed Care – PPO | Admitting: Obstetrics and Gynecology

## 2022-03-19 ENCOUNTER — Ambulatory Visit: Payer: BC Managed Care – PPO | Admitting: Obstetrics and Gynecology

## 2022-05-13 DIAGNOSIS — J018 Other acute sinusitis: Secondary | ICD-10-CM | POA: Diagnosis not present

## 2022-05-16 ENCOUNTER — Ambulatory Visit: Payer: BC Managed Care – PPO | Attending: Critical Care Medicine | Admitting: Critical Care Medicine

## 2022-05-16 ENCOUNTER — Encounter: Payer: Self-pay | Admitting: Critical Care Medicine

## 2022-05-16 ENCOUNTER — Telehealth: Payer: Self-pay | Admitting: Nurse Practitioner

## 2022-05-16 VITALS — BP 152/106 | HR 85 | Temp 98.0°F | Ht 65.0 in | Wt 183.0 lb

## 2022-05-16 DIAGNOSIS — M272 Inflammatory conditions of jaws: Secondary | ICD-10-CM | POA: Diagnosis not present

## 2022-05-16 DIAGNOSIS — R030 Elevated blood-pressure reading, without diagnosis of hypertension: Secondary | ICD-10-CM | POA: Diagnosis not present

## 2022-05-16 DIAGNOSIS — H66002 Acute suppurative otitis media without spontaneous rupture of ear drum, left ear: Secondary | ICD-10-CM

## 2022-05-16 MED ORDER — TRAMADOL HCL 50 MG PO TABS: 50.0000 mg | ORAL_TABLET | Freq: Three times a day (TID) | ORAL | 0 refills | Status: AC | PRN

## 2022-05-16 MED ORDER — CEFDINIR 300 MG PO CAPS: 300.0000 mg | ORAL_CAPSULE | Freq: Two times a day (BID) | ORAL | 0 refills | Status: AC

## 2022-05-16 NOTE — Patient Instructions (Addendum)
Stop amoxicillin clavulanic acid antibiotic  Begin cefdinir 1 twice daily for 10 days for infection in ear and jaw  Obtain Panorex x-ray of jaw and sinus x-rays go to our x-ray department on the first floor you do not need an appointment at walk-in  Obtain labs blood count and metabolic panel  I would eat a liquid diet or pured type diet for now consider Ensure supplements  Work note issued to stay off work for 1 week  Based on lab and x-ray results referrals may be made  Return to see Ms. Fleming in follow-up in 3 weeks  Work note issued stay off work for 1 week

## 2022-05-16 NOTE — Telephone Encounter (Signed)
Carly pls send this to another facility

## 2022-05-16 NOTE — Telephone Encounter (Signed)
Copied from [place] [id number]. Topic: General - Other >> May 16, 2022 12:43 PM Sabas Sous wrote: [facility] called to report that they do not perform DG Orthopantogram (Order [id number]) at their facility. They need the orders sent elsewhere.   Best contact: Elmyra Ricks [phone] ext. [phone]

## 2022-05-16 NOTE — Progress Notes (Unsigned)
   Acute Office Visit  Subjective:     Patient ID: Teresa [name], female    DOB: 1975-09-23, 47 y.o.   MRN: [id number]  Chief Complaint  Patient presents with   Ear Pain    Ear pain, pain on L side of face X3 weeks, pressure behind eyes & head, painful tongue     Last seen 09/2021  PCP Leonidas Romberg visit for headache severe and left ear pain ABX from UC of no help 9   Patient is in today for   ROS      Objective:    BP (!) 152/106 (BP Location: Left Arm, Patient Position: Sitting, Cuff Size: Normal)   Pulse 85   Temp 98 F (36.7 C) (Oral)   Ht '5\' 5"'$  (1.651 m)   Wt 183 lb (83 kg)   LMP 11/22/2021   SpO2 98%   BMI 30.45 kg/m  {Vitals History (Optional):23777}  Physical Exam  No results found for any visits on 05/16/22.      Assessment & Plan:   Problem List Items Addressed This Visit   None   No orders of the defined types were placed in this encounter.   No follow-ups on file.  Asencion Noble, MD

## 2022-05-17 LAB — CBC WITH DIFFERENTIAL/PLATELET
Basophils Absolute: 0 10*3/uL (ref 0.0–0.2)
Basos: 1 %
EOS (ABSOLUTE): 0.1 10*3/uL (ref 0.0–0.4)
Eos: 1 %
Hematocrit: 43.5 % (ref 34.0–46.6)
Hemoglobin: 14.3 g/dL (ref 11.1–15.9)
Immature Grans (Abs): 0 10*3/uL (ref 0.0–0.1)
Immature Granulocytes: 0 %
Lymphocytes Absolute: 1.7 10*3/uL (ref 0.7–3.1)
Lymphs: 33 %
MCH: 27.7 pg (ref 26.6–33.0)
MCHC: 32.9 g/dL (ref 31.5–35.7)
MCV: 84 fL (ref 79–97)
Monocytes Absolute: 0.3 10*3/uL (ref 0.1–0.9)
Monocytes: 6 %
Neutrophils Absolute: 3.1 10*3/uL (ref 1.4–7.0)
Neutrophils: 59 %
Platelets: 248 10*3/uL (ref 150–450)
RBC: 5.16 x10E6/uL (ref 3.77–5.28)
RDW: 15.8 % — ABNORMAL HIGH (ref 11.7–15.4)
WBC: 5.2 10*3/uL (ref 3.4–10.8)

## 2022-05-17 LAB — COMPREHENSIVE METABOLIC PANEL
ALT: 15 IU/L (ref 0–32)
AST: 16 IU/L (ref 0–40)
Albumin/Globulin Ratio: 2 (ref 1.2–2.2)
Albumin: 4.8 g/dL (ref 3.9–4.9)
Alkaline Phosphatase: 93 IU/L (ref 44–121)
BUN/Creatinine Ratio: 6 — ABNORMAL LOW (ref 9–23)
BUN: 5 mg/dL — ABNORMAL LOW (ref 6–24)
Bilirubin Total: 0.3 mg/dL (ref 0.0–1.2)
CO2: 24 mmol/L (ref 20–29)
Calcium: 9.5 mg/dL (ref 8.7–10.2)
Chloride: 102 mmol/L (ref 96–106)
Creatinine, Ser: 0.82 mg/dL (ref 0.57–1.00)
Globulin, Total: 2.4 g/dL (ref 1.5–4.5)
Glucose: 98 mg/dL (ref 70–99)
Potassium: 4.1 mmol/L (ref 3.5–5.2)
Sodium: 141 mmol/L (ref 134–144)
Total Protein: 7.2 g/dL (ref 6.0–8.5)
eGFR: 88 mL/min/{1.73_m2} (ref >59)

## 2022-05-17 NOTE — Assessment & Plan Note (Addendum)
Suspect infection in posterior upper molar gum area  Start cefdinir '300mg'$  bid x 10days  Obtain sinus and panorex Xrays  may yet need CT neck/maxillofacial  Due to severity of pain Rx Tramadol '50mg'$  q8h prn #40  PDMP checked no recent opioid RX

## 2022-05-17 NOTE — Assessment & Plan Note (Signed)
Suspect due to stress , will observe for now

## 2022-05-17 NOTE — Assessment & Plan Note (Addendum)
Otitis media severe left ear Rx cefdinir Check cmet /cbc  Dc augmentin

## 2022-05-17 NOTE — Progress Notes (Signed)
Let pt know all labs normal waiting on Xrays

## 2022-05-18 ENCOUNTER — Telehealth: Payer: Self-pay

## 2022-05-18 ENCOUNTER — Ambulatory Visit (HOSPITAL_COMMUNITY)
Admission: RE | Admit: 2022-05-18 | Discharge: 2022-05-18 | Disposition: A | Discharge status: Home / Self Care | Payer: BC Managed Care – PPO | Source: Ambulatory Visit | Attending: Critical Care Medicine | Admitting: Critical Care Medicine

## 2022-05-18 DIAGNOSIS — H66002 Acute suppurative otitis media without spontaneous rupture of ear drum, left ear: Secondary | ICD-10-CM | POA: Insufficient documentation

## 2022-05-18 DIAGNOSIS — M272 Inflammatory conditions of jaws: Secondary | ICD-10-CM | POA: Diagnosis not present

## 2022-05-18 DIAGNOSIS — R519 Headache, unspecified: Secondary | ICD-10-CM | POA: Diagnosis not present

## 2022-05-18 DIAGNOSIS — R6884 Jaw pain: Secondary | ICD-10-CM | POA: Diagnosis not present

## 2022-05-18 NOTE — Telephone Encounter (Signed)
Please advise 

## 2022-05-18 NOTE — Telephone Encounter (Signed)
Pt was called and is aware of results, DOB was confirmed.  ?

## 2022-05-18 NOTE — Telephone Encounter (Signed)
This radiology group needs to advise who and where to send the order.

## 2022-05-18 NOTE — Telephone Encounter (Signed)
-----   Message from [name], MD sent at 05/17/2022  6:34 AM EST ----- Let pt know all labs normal waiting on Xrays

## 2022-05-18 NOTE — Telephone Encounter (Signed)
I spoke to the patient and advised her to go to the C desk ./ [facility] entrance per Dr Joya Gaskins.  She said she will go today and I reminded her again that she needs to go to the hospital for this, not [address]

## 2022-05-20 NOTE — Progress Notes (Signed)
Let pt know mouth and sinus xrays normal  likely headache from ear infection  inquire how she is doing

## 2022-05-21 ENCOUNTER — Telehealth: Payer: Self-pay

## 2022-05-21 DIAGNOSIS — G4485 Primary stabbing headache: Secondary | ICD-10-CM

## 2022-05-21 DIAGNOSIS — R221 Localized swelling, mass and lump, neck: Secondary | ICD-10-CM

## 2022-05-21 DIAGNOSIS — R590 Localized enlarged lymph nodes: Secondary | ICD-10-CM

## 2022-05-21 NOTE — Telephone Encounter (Signed)
Pt was called and is aware of results, DOB was confirmed.  ?

## 2022-05-21 NOTE — Telephone Encounter (Signed)
Let pt know I want to order CT of head/ maxillo facial / neck  Ordres enterted

## 2022-05-21 NOTE — Addendum Note (Signed)
Addended by: Asencion Noble E on: 05/21/2022 01:58 PM   Modules accepted: Orders

## 2022-05-21 NOTE — Telephone Encounter (Signed)
-----   Message from [name], MD sent at 05/20/2022  9:16 AM EST ----- Let pt know mouth and sinus xrays normal  likely headache from ear infection  inquire how she is doing

## 2022-05-21 NOTE — Telephone Encounter (Signed)
I look at the order and it was resulted on the 6th

## 2022-05-25 NOTE — Telephone Encounter (Signed)
Both ct has been scheduled

## 2022-06-11 ENCOUNTER — Ambulatory Visit: Payer: BC Managed Care – PPO | Admitting: Nurse Practitioner

## 2022-06-11 ENCOUNTER — Ambulatory Visit: Payer: Self-pay

## 2022-06-11 NOTE — Telephone Encounter (Addendum)
   Chief Complaint: Pt.is late for today's appointment Symptoms: Right jaw pain Frequency: weeks Pertinent Negatives: Patient denies fever Disposition: '[]'$ ED /'[]'$ Urgent Care (no appt availability in office) / '[x]'$ Appointment(In office/virtual)/ '[]'$  [place] [facility]/ '[]'$ Home Care/ '[]'$ Refused Recommended Disposition /'[]'$ [place] [facility]/ '[]'$  Follow-up with PCP Additional Notes: Late for appointment.   Answer Assessment - Initial Assessment Questions 1. ONSET: "When did the pain start?" (e.g., minutes, hours, days)     2 weeks ago 2. ONSET: "Does the pain come and go, or has it been constant since it started?" (e.g., constant, intermittent, fleeting)     Constant 3. SEVERITY: "How bad is the pain?"   (Scale 1-10; mild, moderate or severe)   - MILD (1-3): doesn't interfere with normal activities    - MODERATE (4-7): interferes with normal activities or awakens from sleep    - SEVERE (8-10): excruciating pain, unable to do any normal activities      4. LOCATION: "Where does it hurt?"      Left 5. RASH: "Is there any redness, rash, or swelling of the face?"     No 6. FEVER: "Do you have a fever?" If Yes, ask: "What is it, how was it measured, and when did it start?"      No 7. OTHER SYMPTOMS: "Do you have any other symptoms?" (e.g., fever, toothache, nasal discharge, nasal congestion, clicking sensation in jaw joint)     no 8. PREGNANCY: "Is there any chance you are pregnant?" "When was your last menstrual period?"     No  Protocols used: Face Pain-A-AH

## 2022-06-20 ENCOUNTER — Ambulatory Visit: Payer: BC Managed Care – PPO | Admitting: Physician Assistant

## 2022-06-21 ENCOUNTER — Ambulatory Visit
Admission: RE | Admit: 2022-06-21 | Discharge: 2022-06-21 | Disposition: A | Discharge status: Home / Self Care | Payer: BC Managed Care – PPO | Source: Ambulatory Visit | Attending: Critical Care Medicine | Admitting: Critical Care Medicine

## 2022-06-21 DIAGNOSIS — R519 Headache, unspecified: Secondary | ICD-10-CM | POA: Diagnosis not present

## 2022-06-21 DIAGNOSIS — M50322 Other cervical disc degeneration at C5-C6 level: Secondary | ICD-10-CM | POA: Diagnosis not present

## 2022-06-21 DIAGNOSIS — R590 Localized enlarged lymph nodes: Secondary | ICD-10-CM

## 2022-06-21 DIAGNOSIS — M50323 Other cervical disc degeneration at C6-C7 level: Secondary | ICD-10-CM | POA: Diagnosis not present

## 2022-06-21 DIAGNOSIS — Z853 Personal history of malignant neoplasm of breast: Secondary | ICD-10-CM | POA: Diagnosis not present

## 2022-06-21 DIAGNOSIS — G4485 Primary stabbing headache: Secondary | ICD-10-CM

## 2022-06-21 DIAGNOSIS — R221 Localized swelling, mass and lump, neck: Secondary | ICD-10-CM

## 2022-06-21 MED ORDER — IOPAMIDOL (ISOVUE-300) INJECTION 61%
75.0000 mL | Freq: Once | INTRAVENOUS | Status: AC | PRN
  Administered 2022-06-21: 75 mL via INTRAVENOUS

## 2022-06-22 ENCOUNTER — Telehealth: Payer: Self-pay

## 2022-06-22 NOTE — Telephone Encounter (Signed)
Please have her follow up with her dentist and then provide the dental note to me so I can decide where she may need to be referred.

## 2022-06-22 NOTE — Progress Notes (Signed)
Let pt know CT head was normal  no signs of dental/ sinus/brain disease  Ask is she still having pain?  Teresa [name] I am not sure the cause of this pts jaw pain

## 2022-06-22 NOTE — Telephone Encounter (Signed)
Pt was called and is aware of results, DOB was confirmed.  ?

## 2022-06-22 NOTE — Telephone Encounter (Signed)
-----   Message from [name], MD sent at 06/22/2022  2:37 PM EST ----- Let pt know CT head was normal  no signs of dental/ sinus/brain disease  Ask is she still having pain?  Teresa [name] I am not sure the cause of this pts jaw pain

## 2022-06-24 NOTE — Progress Notes (Signed)
Neg CT of neck as well no cause for jaw pain see

## 2022-06-26 NOTE — Telephone Encounter (Signed)
Patient identified by name and date of birth.  Patient aware of response and voiced understanding.

## 2022-07-13 ENCOUNTER — Ambulatory Visit: Payer: BC Managed Care – PPO | Admitting: Nurse Practitioner

## 2022-07-17 ENCOUNTER — Ambulatory Visit: Payer: BC Managed Care – PPO | Admitting: Nurse Practitioner

## 2023-07-09 ENCOUNTER — Other Ambulatory Visit: Payer: Self-pay | Admitting: Family Medicine

## 2023-07-09 DIAGNOSIS — G44201 Tension-type headache, unspecified, intractable: Secondary | ICD-10-CM

## 2024-03-19 IMAGING — MG MM DIGITAL SCREENING BILAT W/ TOMO AND CAD
8 series · 8 of 24 positions shown · non-contrast
Comparison: Previous exam(s).

CLINICAL DATA: Screening.

EXAM:
DIGITAL SCREENING BILATERAL MAMMOGRAM WITH TOMOSYNTHESIS AND CAD
TECHNIQUE: Bilateral screening digital craniocaudal and mediolateral oblique
mammograms were obtained. Bilateral screening digital breast
tomosynthesis was performed. The images were evaluated with
computer-aided detection.

[L MLO synth-2D]
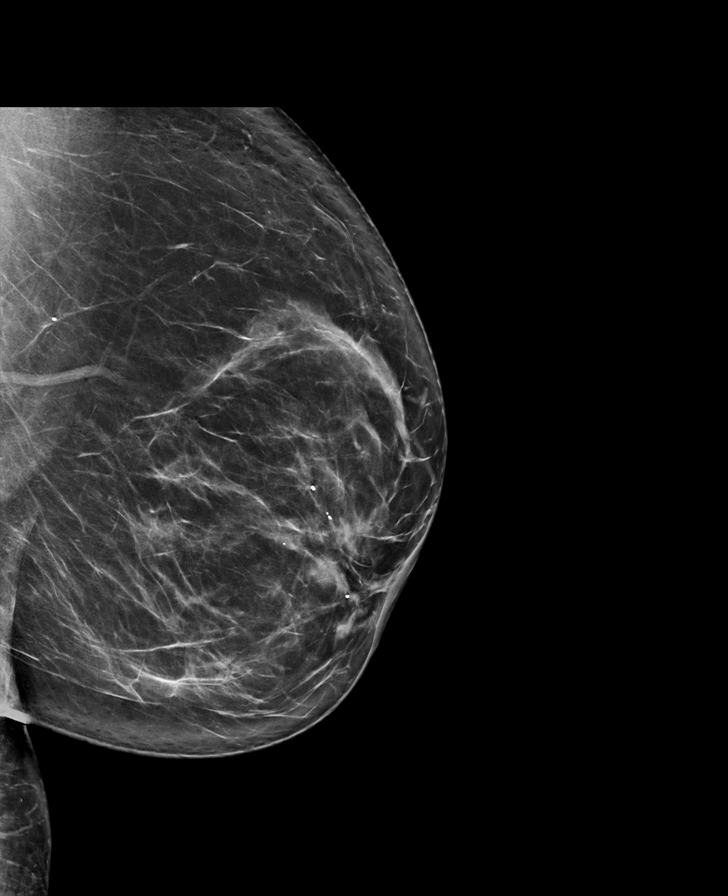

[R CC synth-2D]
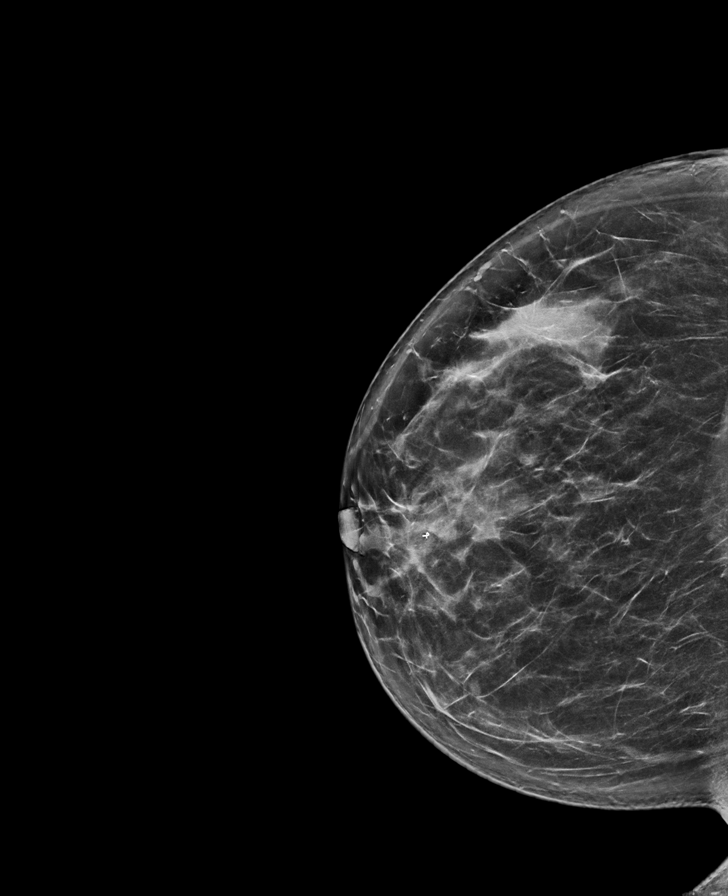

[L CC synth-2D]
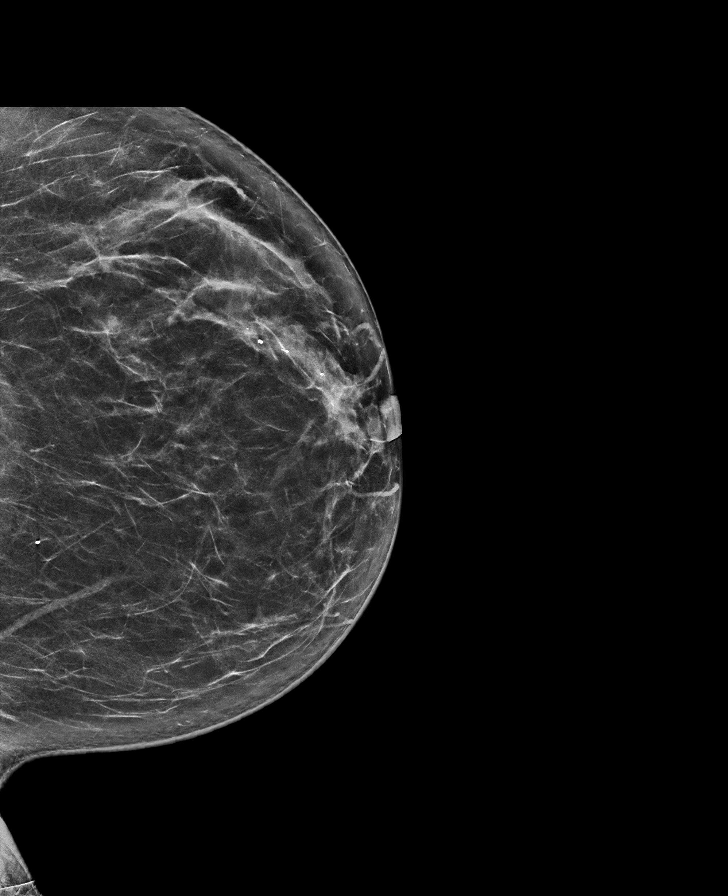

[R MLO synth-2D]
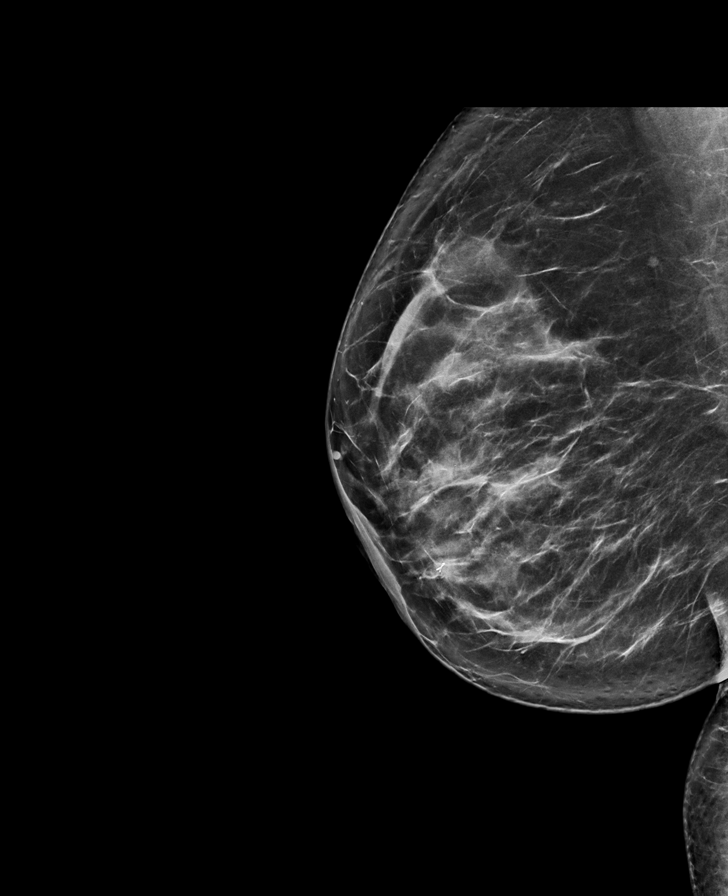

[R MLO tomo · tomo slice 41/82.0]
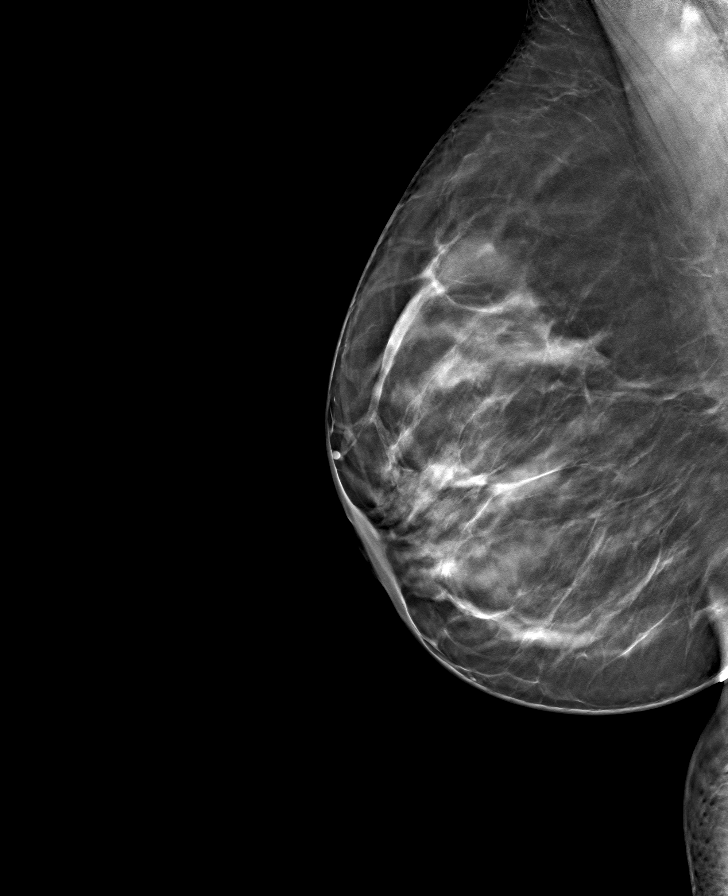

[L CC tomo · tomo slice 42/83.0]
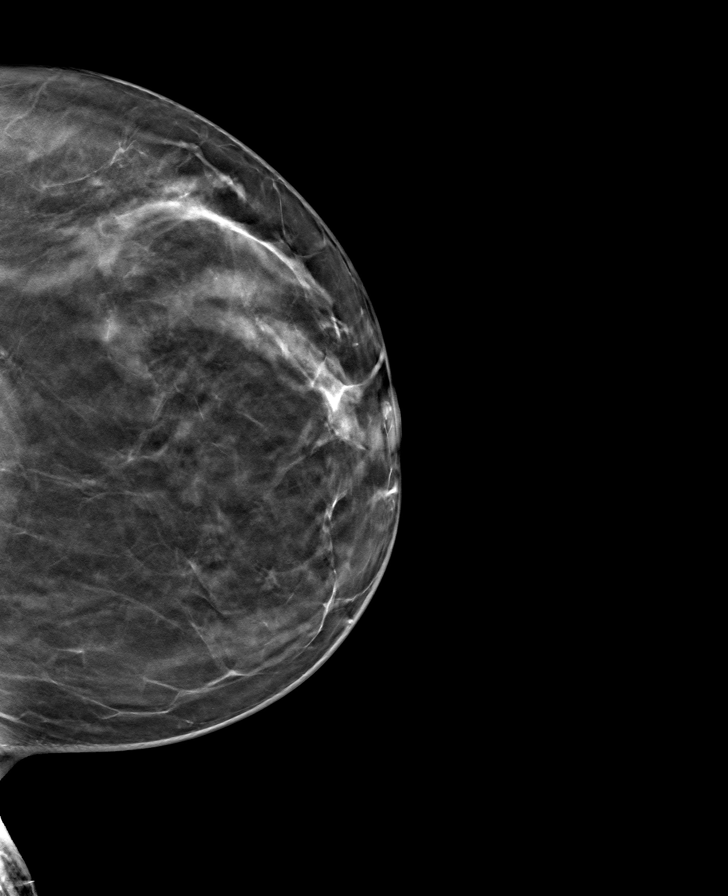

[R CC tomo · tomo slice 42/83.0]
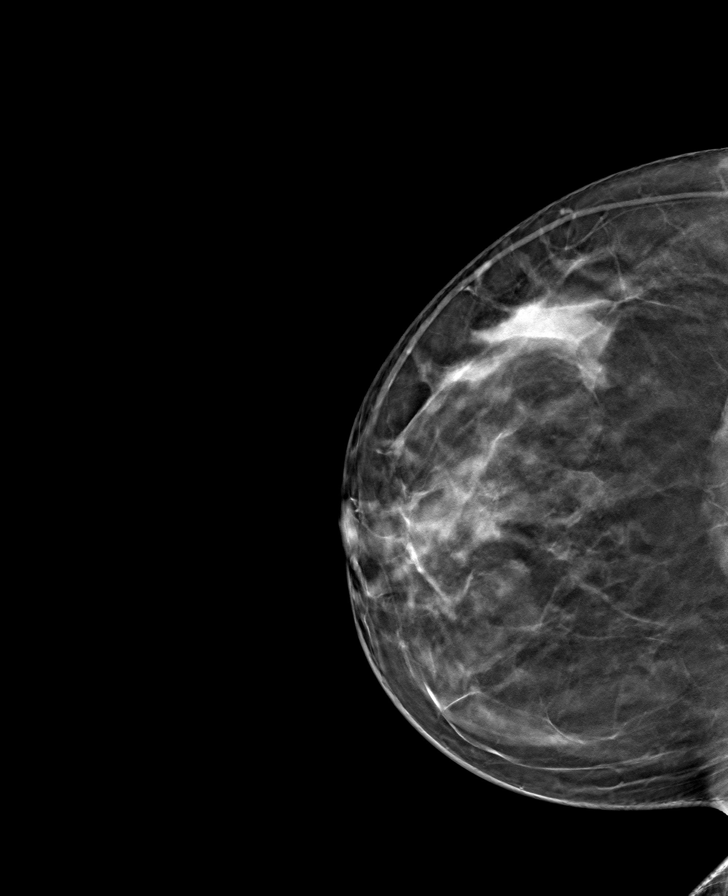

[L MLO tomo · tomo slice 44/87.0]
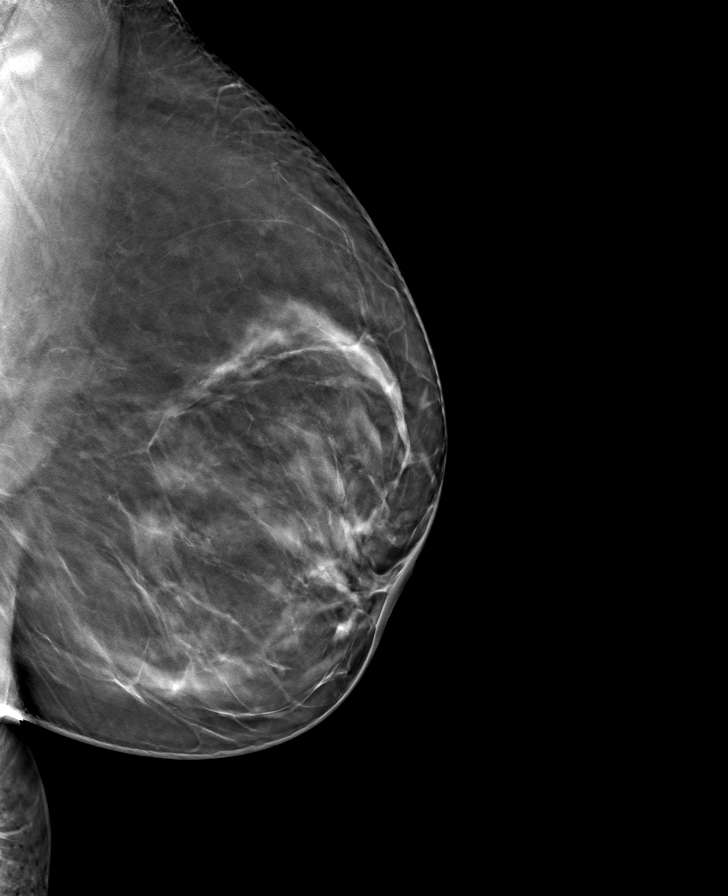

[8 of 24 positions shown; findings below may reference images not displayed]

ACR Breast Density Category b: There are scattered areas of
fibroglandular density.
FINDINGS: There are no findings suspicious for malignancy.
IMPRESSION: No mammographic evidence of malignancy. A result letter of this
screening mammogram will be mailed directly to the patient.

RECOMMENDATION:
Screening mammogram in one year. (Code:51-O-LD2)

BI-RADS CATEGORY  1: Negative.
